# Patient Record
Sex: Male | Born: 1950 | Race: Asian | Hispanic: No | Marital: Married | Smoking: Never smoker
Health system: Southern US, Community
[De-identification: ages and names within clinical notes are randomized; demographics above are authoritative.]

## PROBLEM LIST (undated history)

## (undated) DIAGNOSIS — I4891 Unspecified atrial fibrillation: Secondary | ICD-10-CM

## (undated) DIAGNOSIS — Z8669 Personal history of other diseases of the nervous system and sense organs: Secondary | ICD-10-CM

## (undated) DIAGNOSIS — I10 Essential (primary) hypertension: Secondary | ICD-10-CM

## (undated) DIAGNOSIS — K635 Polyp of colon: Secondary | ICD-10-CM

## (undated) HISTORY — DX: Personal history of other diseases of the nervous system and sense organs: Z86.69

## (undated) HISTORY — PX: EYE SURGERY: SHX253

## (undated) HISTORY — PX: OTHER SURGICAL HISTORY: SHX169

## (undated) HISTORY — DX: Polyp of colon: K63.5

## (undated) HISTORY — DX: Unspecified atrial fibrillation: I48.91

## (undated) HISTORY — PX: COLONOSCOPY: SHX174

## (undated) HISTORY — DX: Essential (primary) hypertension: I10

## (undated) HISTORY — PX: LIPOMA EXCISION: SHX5283

---

## 2008-06-28 ENCOUNTER — Ambulatory Visit (HOSPITAL_COMMUNITY): Admission: RE | Admit: 2008-06-28 | Discharge: 2008-06-28 | Payer: Self-pay | Admitting: General Surgery

## 2008-07-26 ENCOUNTER — Ambulatory Visit: Payer: Self-pay | Admitting: Family Medicine

## 2008-07-26 DIAGNOSIS — I10 Essential (primary) hypertension: Secondary | ICD-10-CM | POA: Insufficient documentation

## 2008-08-02 ENCOUNTER — Encounter (INDEPENDENT_AMBULATORY_CARE_PROVIDER_SITE_OTHER): Payer: Self-pay | Admitting: General Surgery

## 2008-08-02 ENCOUNTER — Ambulatory Visit (HOSPITAL_COMMUNITY): Admission: RE | Admit: 2008-08-02 | Discharge: 2008-08-02 | Payer: Self-pay | Admitting: General Surgery

## 2008-09-06 ENCOUNTER — Telehealth: Payer: Self-pay | Admitting: Family Medicine

## 2008-09-09 ENCOUNTER — Ambulatory Visit: Payer: Self-pay | Admitting: Family Medicine

## 2008-09-09 DIAGNOSIS — R42 Dizziness and giddiness: Secondary | ICD-10-CM | POA: Insufficient documentation

## 2008-10-27 ENCOUNTER — Encounter: Payer: Self-pay | Admitting: Family Medicine

## 2008-11-09 ENCOUNTER — Encounter: Admission: RE | Admit: 2008-11-09 | Discharge: 2008-11-09 | Payer: Self-pay | Admitting: Otolaryngology

## 2009-02-21 ENCOUNTER — Ambulatory Visit: Payer: Self-pay | Admitting: Family Medicine

## 2009-02-21 DIAGNOSIS — M6530 Trigger finger, unspecified finger: Secondary | ICD-10-CM | POA: Insufficient documentation

## 2009-02-21 DIAGNOSIS — K0501 Acute gingivitis, non-plaque induced: Secondary | ICD-10-CM | POA: Insufficient documentation

## 2009-05-03 ENCOUNTER — Telehealth: Payer: Self-pay | Admitting: Family Medicine

## 2009-10-06 ENCOUNTER — Ambulatory Visit: Payer: Self-pay | Admitting: Family Medicine

## 2009-10-06 DIAGNOSIS — M109 Gout, unspecified: Secondary | ICD-10-CM | POA: Insufficient documentation

## 2009-10-07 LAB — CONVERTED CEMR LAB: Uric Acid, Serum: 7.1 mg/dL (ref 4.0–7.8)

## 2009-10-11 ENCOUNTER — Ambulatory Visit: Payer: Self-pay | Admitting: Family Medicine

## 2009-10-11 DIAGNOSIS — G4700 Insomnia, unspecified: Secondary | ICD-10-CM | POA: Insufficient documentation

## 2009-12-14 ENCOUNTER — Ambulatory Visit: Payer: Self-pay | Admitting: Family Medicine

## 2010-01-11 ENCOUNTER — Ambulatory Visit: Payer: Self-pay | Admitting: Family Medicine

## 2010-02-14 NOTE — Assessment & Plan Note (Signed)
Summary: elevated BP/dm   Vital Signs:  Patient profile:   60 year old male Weight:      155 pounds Temp:     98.6 degrees F oral BP sitting:   150 / 98  (left arm) Cuff size:   regular  Vitals Entered By: Sid Falcon LPN (December 14, 2009 8:15 AM)  Serial Vital Signs/Assessments:  Time      Position  BP       Pulse  Resp  Temp     By                     138/80                         Evelena Peat MD   History of Present Illness: Here to evaluate hypertension.  Up since recent gout flare. Home readings around 150/90s.  Not taking indocin or ETOH. Limits sodium. Takes Enalapril and metoprolol consistently.  Hypertension History:      He denies headache, chest pain, palpitations, dyspnea with exertion, orthopnea, PND, peripheral edema, visual symptoms, neurologic problems, syncope, and side effects from treatment.        Positive major cardiovascular risk factors include male age 38 years old or older and hypertension.  Negative major cardiovascular risk factors include non-tobacco-user status.     Allergies (verified): No Known Drug Allergies  Past History:  Past Medical History: Last updated: 10/11/2009 Hypertension ? past hx of atrial fibrillation Gout 9/11  Physical Exam  General:  Well-developed,well-nourished,in no acute distress; alert,appropriate and cooperative throughout examination Neck:  No deformities, masses, or tenderness noted. Lungs:  Normal respiratory effort, chest expands symmetrically. Lungs are clear to auscultation, no crackles or wheezes. Heart:  Normal rate and regular rhythm. S1 and S2 normal without gallop, murmur, click, rub or other extra sounds. Extremities:  No clubbing, cyanosis, edema, or deformity noted with normal full range of motion of all joints.     Impression & Recommendations:  Problem # 1:  HYPERTENSION (ICD-401.9) Assessment Deteriorated d/c enalapril and start Lotrel.  Cont metoprolol and reassess one months. His  updated medication list for this problem includes:    Amlodipine Besy-benazepril Hcl 5-10 Mg Caps (Amlodipine besy-benazepril hcl) ..... One by mouth once daily    Metoprolol Tartrate 50 Mg Tabs (Metoprolol tartrate) ..... One tab two times a day  Complete Medication List: 1)  Amlodipine Besy-benazepril Hcl 5-10 Mg Caps (Amlodipine besy-benazepril hcl) .... One by mouth once daily 2)  Metoprolol Tartrate 50 Mg Tabs (Metoprolol tartrate) .... One tab two times a day 3)  Aspir-low 81 Mg Tbec (Aspirin) .... Once daily 4)  Indomethacin 50 Mg Caps (Indomethacin) .... One by mouth q 8 hours as needed foot pain 5)  Ambien 10 Mg Tabs (Zolpidem tartrate) .... One by mouth at bedtime as needed insomnia  Hypertension Assessment/Plan:      The patient's hypertensive risk group is category B: At least one risk factor (excluding diabetes) with no target organ damage.  Today's blood pressure is 150/98.    Patient Instructions: 1)  Please schedule a follow-up appointment in 1 month.  2)  Discontinue Enalapril and start Lotrel one daily. Prescriptions: AMLODIPINE BESY-BENAZEPRIL HCL 5-10 MG CAPS (AMLODIPINE BESY-BENAZEPRIL HCL) one by mouth once daily  #30 x 5   Entered and Authorized by:   Evelena Peat MD   Signed by:   Evelena Peat MD on 12/14/2009   Method used:  [facility]  [place] [address] [facility]       [address]       [place], [place]  [address]       Ph: [phone] or [phone]       Fax: [phone]   RxID:   [id number]    Orders Added: 1)  Est. Patient Level III [42595]

## 2010-02-14 NOTE — Progress Notes (Signed)
Summary: Penicillin refill request  Phone Note From Pharmacy   [facility]  [place] [address] Call For: Mark [name]  Summary of Call: Pharmacy faxed Rx refill for Penicillin.  I called pt to explain reason for request, he states his gums are swollen again Initial call taken by: Sid Falcon LPN,  May 03, 2009 12:47 PM  Follow-up for Phone Call        OK to refill once PCN V 500 mg by mouth three times a day for 10 days. Follow-up by: Evelena Peat MD,  May 03, 2009 12:54 PM    Prescriptions: PENICILLIN V POTASSIUM 500 MG TABS (PENICILLIN V POTASSIUM) one by mouth three times a day for 10 days  #30 x 0   Entered by:   Sid Falcon LPN   Authorized by:   Evelena Peat MD   Signed by:   Sid Falcon LPN on 16/10/9602   Method used:   [facility]        [facility]  [place] [address] [facility]       [address]       [place], [place]  [address]       Ph: [phone] or [phone]       Fax: [phone]   RxID:   [id number]

## 2010-02-14 NOTE — Assessment & Plan Note (Signed)
Summary: foot pain/njr   Vital Signs:  Patient profile:   60 year old male Weight:      154 pounds Temp:     98.6 degrees F oral BP sitting:   150 / 100  (left arm) Cuff size:   regular  Vitals Entered By: Sid Falcon LPN (October 06, 2009 2:58 PM) CC: right foot reddened, painful X 1 week   History of Present Illness: Same-day appointment. Right foot pain metatarsophalangeal joint one week. No injury. No fever. Has taken no medications. No past history of gout.  Pain is moderate severity. He has noticed some edema and erythema. No other joint involvement. Minimal alcohol use.  Allergies (verified): No Known Drug Allergies  Past History:  Past Medical History: Last updated: 07/26/2008 Hypertension ? past hx of atrial fibrillation  Review of Systems      See HPI  Physical Exam  General:  Well-developed,well-nourished,in no acute distress; alert,appropriate and cooperative throughout examination Lungs:  Normal respiratory effort, chest expands symmetrically. Lungs are clear to auscultation, no crackles or wheezes. Heart:  Normal rate and regular rhythm. S1 and S2 normal without gallop, murmur, click, rub or other extra sounds. Extremities:  patient has erythema, warmth and mild edema right MTP joint. Minimal to moderate tenderness to palpation.   Impression & Recommendations:  Problem # 1:  GOUT, UNSPECIFIED (ICD-274.9) Assessment New very likely gout, though no prior hx of.  Start Indocin .  Uric acid level and f/u one week. Orders: TLB-Uric Acid, Blood ([id number]-[id number])  His updated medication list for this problem includes:    Indomethacin 50 Mg Caps (Indomethacin) ..... One by mouth q 8 hours as needed foot pain  Complete Medication List: 1)  Enalapril Maleate 20 Mg Tabs (Enalapril maleate) .... One tab two times a day 2)  Metoprolol Tartrate 50 Mg Tabs (Metoprolol tartrate) .... One tab two times a day 3)  Aspir-low 81 Mg Tbec (Aspirin) .... Once daily 4)   Indomethacin 50 Mg Caps (Indomethacin) .... One by mouth q 8 hours as needed foot pain  Patient Instructions: 1)  Scheduled follow up appointment in one week 2)  Drink plenty of water Prescriptions: INDOMETHACIN 50 MG CAPS (INDOMETHACIN) one by mouth q 8 hours as needed foot pain  #40 x 0   Entered and Authorized by:   Evelena Peat MD   Signed by:   Evelena Peat MD on 10/06/2009   Method used:   [facility]  [place] [address]       [address]       [place], [place]  [address]       Ph: [phone] or [phone]       Fax: [phone]   RxID:   [id number]

## 2010-02-14 NOTE — Assessment & Plan Note (Signed)
Summary: F/U TO REASSESS GOUT? // RS---PT [facility] // RS/pt rescd//ccm   Vital Signs:  Patient profile:   60 year old male Temp:     98.4 degrees F oral BP sitting:   160 / 110  (left arm) Cuff size:   regular  Vitals Entered By: Sid Falcon LPN (October 11, 2009 1:25 PM)  History of Present Illness: Patient here for the following.  Reassess gout right foot. Initial occurrence. Improved promptly with indomethacin. No pain whatsoever this time. Had some insomnia which he attributed to indomethacin. Took last dose yesterday. Ambulating without pain. Drinking plenty of fluids.  No regular alcohol use.  Uric acid 7.1 .  No recent foot injury.  Hypertension treated with enalapril and metoprolol. Compliant with medications. No chest pains  patient complaining of insomnia past few nights. He thinks this is related to indomethacin. Minimal caffeine use. No regular alcohol use. Took Advil PM without relief. Only got one to 2 hours sleep last night. In inquiring about possible sleep aids. No recent new stressors.  Allergies (verified): No Known Drug Allergies  Past History:  Family History: Last updated: 07/26/2008 Family History Hypertension, mother Family history stroke, mother Family history breast cancer, sister  Social History: Last updated: 07/26/2008 Occupation:  Sports administrator Married Never Smoked Alcohol use-yes  Risk Factors: Smoking Status: never (02/21/2009)  Past Medical History: Hypertension ? past hx of atrial fibrillation Gout 9/11 PMH-FH-SH reviewed for relevance  Review of Systems  The patient denies anorexia, fever, weight loss, chest pain, syncope, dyspnea on exertion, peripheral edema, prolonged cough, headaches, hemoptysis, abdominal pain, melena, hematochezia, and severe indigestion/heartburn.    Physical Exam  General:  Well-developed,well-nourished,in no acute distress; alert,appropriate and cooperative throughout examination Mouth:  Oral mucosa  and oropharynx without lesions or exudates.  Teeth in good repair. Neck:  No deformities, masses, or tenderness noted. Lungs:  Normal respiratory effort, chest expands symmetrically. Lungs are clear to auscultation, no crackles or wheezes. Heart:  Normal rate and regular rhythm. S1 and S2 normal without gallop, murmur, click, rub or other extra sounds. Extremities:  resolution of recent redness warmth and tenderness right metatarsophalangeal joint Psych:  normally interactive, good eye contact, not anxious appearing, and not depressed appearing.     Impression & Recommendations:  Problem # 1:  GOUT, UNSPECIFIED (ICD-274.9) Assessment Improved  His updated medication list for this problem includes:    Indomethacin 50 Mg Caps (Indomethacin) ..... One by mouth q 8 hours as needed foot pain  Problem # 2:  HYPERTENSION (ICD-401.9) Assessment: Deteriorated ?exacerbated by Indocin. His updated medication list for this problem includes:    Enalapril Maleate 20 Mg Tabs (Enalapril maleate) ..... One tab two times a day    Metoprolol Tartrate 50 Mg Tabs (Metoprolol tartrate) ..... One tab two times a day  Problem # 3:  INSOMNIA, TRANSIENT (ICD-780.52) Assessment: New short term trial of Ambien and sleep hygiene handdout given. His updated medication list for this problem includes:    Ambien 10 Mg Tabs (Zolpidem tartrate) ..... One by mouth at bedtime as needed insomnia  Complete Medication List: 1)  Enalapril Maleate 20 Mg Tabs (Enalapril maleate) .... One tab two times a day 2)  Metoprolol Tartrate 50 Mg Tabs (Metoprolol tartrate) .... One tab two times a day 3)  Aspir-low 81 Mg Tbec (Aspirin) .... Once daily 4)  Indomethacin 50 Mg Caps (Indomethacin) .... One by mouth q 8 hours as needed foot pain 5)  Ambien 10 Mg Tabs (Zolpidem tartrate) .Marland KitchenMarland KitchenMarland Kitchen  One by mouth at bedtime as needed insomnia  Patient Instructions: 1)  Please schedule a follow-up appointment in 3 months .  2)  Check your  Blood  Pressure regularly . If it is above:140/90   you should make an appointment. 3)  Use ambien only on short term as needed basis. Prescriptions: AMBIEN 10 MG TABS (ZOLPIDEM TARTRATE) one by mouth at bedtime as needed insomnia  #15 x 0   Entered and Authorized by:   Evelena Peat MD   Signed by:   Evelena Peat MD on 10/11/2009   Method used:   Print then Give to Patient   RxID:   [id number]

## 2010-02-14 NOTE — Assessment & Plan Note (Signed)
Summary: med check/refills/cjr   Vital Signs:  Patient profile:   60 year old male Weight:      152 pounds Temp:     99.2 degrees F oral BP sitting:   128 / 82  (left arm) Cuff size:   regular  Vitals Entered By: Mark Falcon LPN (February 21, 2009 8:26 AM) CC: med check, Hypertension Management   History of Present Illness: Seen today for the following items.  Hypertension treated with enalapril metoprolol. Compliant with medications. No concerning side effects. No dizziness or orthostatic symptoms.  New problem of trigger finger right fourth digit. 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 Not disabling. Symptoms actually improved last couple weeks. Right-hand-dominant.  Gum pain left lower gumline. No dental care. No purulent drainage. History of similar occurrence in the past which responded to antibiotics. No fevers or chills.  Recent vertigo w/u with MRI neg.  Symptoms stable.  Hypertension History:      He denies headache, chest pain, palpitations, dyspnea with exertion, orthopnea, PND, peripheral edema, visual symptoms, neurologic problems, syncope, and side effects from treatment.  He notes no problems with any antihypertensive medication side effects.        Positive major cardiovascular risk factors include male age 20 years old or older and hypertension.  Negative major cardiovascular risk factors include non-tobacco-user status.     Preventive Screening-Counseling & Management  Alcohol-Tobacco     Smoking Status: never  Allergies (verified): No Known Drug Allergies  Past History:  Past Medical History: Last updated: 07/26/2008 Hypertension ? 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    Physical Exam  General:  Well-developed,well-nourished,in no acute distress; alert,appropriate and cooperative throughout examination Eyes:  No corneal or conjunctival inflammation noted. EOMI. Perrla. Funduscopic exam benign, without hemorrhages, exudates or papilledema. Vision grossly normal. Ears:  External ear exam shows no significant lesions or deformities.  Otoscopic examination reveals clear canals, tympanic membranes are intact bilaterally without bulging, retraction, inflammation or discharge. Hearing is grossly normal bilaterally. Mouth:  patient has some erythema and minimal gum swelling left lower gum line and near the incisors. No purulent drainage noted. Otherwise oropharynx clear Neck:  No deformities, masses, or tenderness noted. Lungs:  Normal respiratory effort, chest expands symmetrically. Lungs are clear to auscultation, no crackles or wheezes. Heart:  Normal rate and regular rhythm. S1 and S2 normal without gallop, murmur, click, rub or other extra sounds. Extremities:  thickened nodule right fourth digit in palm. No significant trigger finger noted this time.   Impression & Recommendations:  Problem # 1:  HYPERTENSION (ICD-401.9) Assessment Unchanged refill meds. His updated medication list for this problem includes:    Enalapril Maleate 20 Mg Tabs (Enalapril maleate) ..... One tab two times a day    Metoprolol Tartrate 50 Mg Tabs (Metoprolol tartrate) ..... One tab two times a day  Problem # 2:  TRIGGER FINGER (ICD-727.03) Assessment: New discuss treatment options including corticosteroid injection this point he wishes to wait and observe  Problem # 3:  ACUTE GINGIVITIS NONPLAQUE INDUCED  (ICD-523.01) Assessment: New start antibiotic. Dental  followup if symptoms do not improve or resolve next couple of weeks  Complete Medication List: 1)  Enalapril Maleate 20 Mg Tabs (Enalapril maleate) .... One tab two times a day 2)  Metoprolol Tartrate 50 Mg Tabs (Metoprolol tartrate) .... One tab two times a day 3)  Aspir-low 81 Mg Tbec (Aspirin) .... Once daily 4)  Penicillin V Potassium 500 Mg Tabs (Penicillin v potassium) .... One by mouth three times a day for 10 days  Hypertension Assessment/Plan:      The patient's hypertensive risk group is category B: At least one risk factor (excluding diabetes) with no target organ damage.  Today's blood pressure is 128/82.    Patient Instructions: 1)  Consider scheduling complete physical examination at some point later this year Prescriptions: METOPROLOL TARTRATE 50 MG TABS (METOPROLOL TARTRATE) one tab two times a day  #180 x 3   Entered and Authorized by:   Evelena Peat MD   Signed by:   Evelena Peat MD on 02/21/2009   Method used:   [facility]        [facility]  [place] [address] [facility]       [address]       [place], [place]  [address]       Ph: [phone] or [phone]       Fax: [phone]   RxID:   [id number] ENALAPRIL MALEATE 20 MG TABS (ENALAPRIL MALEATE) one tab two times a day  #180 x 3   Entered and Authorized by:   Evelena Peat MD   Signed by:   Evelena Peat MD on 02/21/2009   Method used:   [facility]        [facility]  [place] [address]       [address]       [place], [place]  [address]       Ph: [phone] or [phone]       Fax: [phone]   RxID:   [id number] PENICILLIN V POTASSIUM 500 MG TABS (PENICILLIN V POTASSIUM) one by mouth three times a day for 10 days  #30 x 0   Entered and Authorized by:   Evelena Peat MD   Signed by:   Evelena Peat MD on 02/21/2009   Method used:   [facility]        [facility]  [place] [address]       [address]        [place], [place]  [address]       Ph: [phone] or [phone]       Fax: [phone]   RxID:   [id number]

## 2010-02-16 NOTE — Assessment & Plan Note (Signed)
Summary: 1 month rov//acm pt rsc/njr   Vital Signs:  Patient profile:   60 year old male Weight:      154 pounds Temp:     98.5 degrees F oral BP sitting:   122 / 80  (left arm) Cuff size:   regular  Vitals Entered By: Sid Falcon LPN (January 11, 2010 8:18 AM)  History of Present Illness: Here for follow up hypertension. Changed from enalapril to Lotrel and BP have improved. No side effects and BP around 120/75 average. No headaches, edema, or cough.    Hypertension History:      He denies headache, chest pain, palpitations, dyspnea with exertion, orthopnea, peripheral edema, neurologic problems, syncope, and side effects from treatment.        Positive major cardiovascular risk factors include male age 4 years old or older and hypertension.  Negative major cardiovascular risk factors include non-tobacco-user status.     Allergies (verified): No Known Drug Allergies  Past History:  Past Medical History: Last updated: 10/11/2009 Hypertension ? past hx of atrial fibrillation Gout 9/11 PMH reviewed for relevance  Review of Systems      See HPI  Physical Exam  General:  Well-developed,well-nourished,in no acute distress; alert,appropriate and cooperative throughout examination Head:  Normocephalic and atraumatic without obvious abnormalities. No apparent alopecia or balding. Neck:  No deformities, masses, or tenderness noted. Lungs:  Normal respiratory effort, chest expands symmetrically. Lungs are clear to auscultation, no crackles or wheezes. Heart:  Normal rate and regular rhythm. S1 and S2 normal without gallop, murmur, click, rub or other extra sounds. Extremities:  no edema   Impression & Recommendations:  Problem # 1:  HYPERTENSION (ICD-401.9) Assessment Improved  His updated medication list for this problem includes:    Amlodipine Besy-benazepril Hcl 5-10 Mg Caps (Amlodipine besy-benazepril hcl) ..... One by mouth once daily    Metoprolol Tartrate 50  Mg Tabs (Metoprolol tartrate) ..... One tab two times a day  Complete Medication List: 1)  Amlodipine Besy-benazepril Hcl 5-10 Mg Caps (Amlodipine besy-benazepril hcl) .... One by mouth once daily 2)  Metoprolol Tartrate 50 Mg Tabs (Metoprolol tartrate) .... One tab two times a day 3)  Aspir-low 81 Mg Tbec (Aspirin) .... Once daily 4)  Indomethacin 50 Mg Caps (Indomethacin) .... One by mouth q 8 hours as needed foot pain 5)  Ambien 10 Mg Tabs (Zolpidem tartrate) .... One by mouth at bedtime as needed insomnia  Hypertension Assessment/Plan:      The patient's hypertensive risk group is category B: At least one risk factor (excluding diabetes) with no target organ damage.  Today's blood pressure is 122/80.    Patient Instructions: 1)  Please schedule a follow-up appointment in 6 months .  2)  Check your  Blood Pressure regularly . If it is above:140/90   you should make an appointment.   Orders Added: 1)  Est. Patient Level III [[address]]

## 2010-03-15 ENCOUNTER — Other Ambulatory Visit: Payer: Self-pay | Admitting: Family Medicine

## 2010-03-15 DIAGNOSIS — I10 Essential (primary) hypertension: Secondary | ICD-10-CM

## 2010-03-15 NOTE — Telephone Encounter (Signed)
[facility] and Amlodipine. 3 month supply with refills. Pls call in to [facility].

## 2010-03-16 MED ORDER — METOPROLOL TARTRATE 50 MG PO TABS: 50.0000 mg | ORAL_TABLET | Freq: Two times a day (BID) | ORAL | Status: DC

## 2010-03-16 MED ORDER — AMLODIPINE BESY-BENAZEPRIL HCL 5-10 MG PO CAPS: 1.0000 | ORAL_CAPSULE | Freq: Every day | ORAL | Status: DC

## 2010-03-16 NOTE — Telephone Encounter (Signed)
Rx sent electronically.  

## 2010-04-23 LAB — BASIC METABOLIC PANEL
Chloride: 106 mEq/L (ref 96–112)
GFR calc Af Amer: >60 mL/min (ref >60)
GFR calc non Af Amer: >60 mL/min (ref >60)
Potassium: 4.4 mEq/L (ref 3.5–5.1)
Sodium: 140 mEq/L (ref 135–145)

## 2010-04-23 LAB — DIFFERENTIAL
Eosinophils Absolute: 0.2 10*3/uL (ref 0.0–0.7)
Eosinophils Relative: 5 % (ref 0–5)
Lymphocytes Relative: 22 % (ref 12–46)
Lymphs Abs: 1.1 10*3/uL (ref 0.7–4.0)
Monocytes Relative: 7 % (ref 3–12)

## 2010-04-23 LAB — CBC
HCT: 50.7 % (ref 39.0–52.0)
MCV: 93.3 fL (ref 78.0–100.0)
RBC: 5.44 MIL/uL (ref 4.22–5.81)
WBC: 5.1 10*3/uL (ref 4.0–10.5)

## 2010-04-24 LAB — BASIC METABOLIC PANEL
CO2: 32 mEq/L (ref 19–32)
Calcium: 9.7 mg/dL (ref 8.4–10.5)
Chloride: 105 mEq/L (ref 96–112)
Creatinine, Ser: 1.03 mg/dL (ref 0.4–1.5)
Glucose, Bld: 103 mg/dL — ABNORMAL HIGH (ref 70–99)
Sodium: 143 mEq/L (ref 135–145)

## 2010-04-24 LAB — DIFFERENTIAL
Basophils Absolute: 0 10*3/uL (ref 0.0–0.1)
Eosinophils Relative: 6 % — ABNORMAL HIGH (ref 0–5)
Lymphocytes Relative: 29 % (ref 12–46)
Lymphs Abs: 1.4 10*3/uL (ref 0.7–4.0)
Monocytes Absolute: 0.4 10*3/uL (ref 0.1–1.0)

## 2010-04-24 LAB — CBC
HCT: 51 % (ref 39.0–52.0)
Hemoglobin: 17.9 g/dL — ABNORMAL HIGH (ref 13.0–17.0)
RDW: 13 % (ref 11.5–15.5)

## 2010-05-22 ENCOUNTER — Telehealth: Payer: Self-pay | Admitting: Family Medicine

## 2010-05-22 DIAGNOSIS — Z299 Encounter for prophylactic measures, unspecified: Secondary | ICD-10-CM

## 2010-05-22 NOTE — Telephone Encounter (Signed)
Please advise 

## 2010-05-22 NOTE — Telephone Encounter (Signed)
Pt needs refill

## 2010-05-22 NOTE — Telephone Encounter (Signed)
I would rec referral to [facility] unless he has another preference.

## 2010-05-22 NOTE — Telephone Encounter (Signed)
Pt called and said that he had a colonoscopy 5 yrs ago by Dr Sherin Quarry. This doctor has retired, so pt is req a referral to get another GI Doctor and to get colonoscopy done in early July 2012.

## 2010-05-23 NOTE — Telephone Encounter (Signed)
Referral done, pt informed

## 2010-05-30 NOTE — Op Note (Signed)
Mark [name], Mark [name]                   ACCOUNT NO.:  [id number]   MEDICAL RECORD NO.:  [id number]          PATIENT TYPE:  AMB   LOCATION:  SDS                          FACILITY:  [facility]   PHYSICIAN:  Juanetta Gosling, MDDATE OF BIRTH:  [birth year]   DATE OF PROCEDURE:  08/02/2008  DATE OF DISCHARGE:  08/02/2008                               OPERATIVE REPORT   PREOPERATIVE DIAGNOSIS:  Back mass.   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   PROCEDURE:  Back mass excisional biopsy.   SURGEON:  Juanetta Gosling, MD   ASSISTANT:  None.   ANESTHESIA:  General in the prone position.   SPECIMEN:  Lipoma to pathology.   ESTIMATED BLOOD LOSS:  Minimal.   COMPLICATIONS:  None.   DRAINS:  None.   SUPERVISING ANESTHESIOLOGIST:  Sheldon Silvan, MD   DISPOSITION:  Recovery room in stable condition.   HISTORY:  [name] is a 60 year old male with a number of years' history  of an upper back mass that has been slowly increasing in the size over  that time.  He came in to have this evaluated this for removal.  He had  about a 4- x 3-cm mobile mass appearing consistent with a lipoma.  On  his preoperative evaluation, he was noted to have asymptomatic atrial  fibrillation.  He was evaluated by Cardiology and risk stratified.  We  discussed an excisional biopsy of this mass.   PROCEDURE:  After informed consent was obtained, the patient was taken  to the operating room.  He was administered 1 g of intravenous  cefazolin.  He had sequential compression devices placed on his lower  extremities prior to operation.  He was then placed under general  endotracheal anesthesia without complication.  He was placed in the  prone position and appropriately padded.  He was then prepped with  ChloraPrep.  Three minutes were allowed to pass.  He was then draped in  a standard sterile surgical fashion.  Surgical time-out was then  performed.   The lipoma was  identified, about 5-cm incision was then made overlying  this.  Dissection carried out down to the level of the mass.  This was  removed in total and passed off the table as a specimen.  This was  adherent to the muscle, and I did remove some of the fascia along with  it, but it appeared well encapsulated and appeared benign on removal.  There was one feeder vessel that I required a 3-0 Vicryl stitch in that  position to stop the bleeding.  Irrigation was performed.  Hemostasis  was observed.  I put a couple of deep sutures of 2-0 Vicryl to pull the  layers together and closed the dermis with 4-0 Vicryl interrupted  sutures.  The skin was closed with a 4-0 Monocryl in a subcuticular  fashion.  Steri-Strips and sterile dressing were placed over this.  He  tolerated this well, was rolled supine, and extubated in the operating  room, having tolerated this well.  Juanetta Gosling, MD  Electronically Signed     MCW/MEDQ  D:  08/02/2008  T:  08/03/2008  Job:  [id number]

## 2010-06-14 ENCOUNTER — Telehealth: Payer: Self-pay | Admitting: Family Medicine

## 2010-06-14 DIAGNOSIS — Z1211 Encounter for screening for malignant neoplasm of colon: Secondary | ICD-10-CM

## 2010-06-14 NOTE — Telephone Encounter (Signed)
Pt would like a colonoscopy due to age on 7-2 or 07-18-2010.

## 2010-06-14 NOTE — Telephone Encounter (Signed)
Agree that he should have screening colonoscopy at his age.  OK to refer.  I will make referral,

## 2010-06-15 ENCOUNTER — Other Ambulatory Visit: Payer: Self-pay | Admitting: Internal Medicine

## 2010-06-15 NOTE — Telephone Encounter (Signed)
Pt is aware doc will make referral for colonoscopy

## 2010-07-03 ENCOUNTER — Telehealth: Payer: Self-pay | Admitting: Family Medicine

## 2010-07-03 NOTE — Telephone Encounter (Signed)
Wants referral for to see a specialist about his back pain.

## 2010-07-03 NOTE — Telephone Encounter (Signed)
Pt informed and he voiced his understanding. 

## 2010-07-03 NOTE — Telephone Encounter (Signed)
We have not seen him here yet for his back pain and would rec evaluation here first.

## 2010-07-17 ENCOUNTER — Other Ambulatory Visit: Payer: Self-pay | Admitting: Gastroenterology

## 2010-07-20 ENCOUNTER — Encounter: Payer: Self-pay | Admitting: Family Medicine

## 2010-07-20 ENCOUNTER — Ambulatory Visit (INDEPENDENT_AMBULATORY_CARE_PROVIDER_SITE_OTHER): Payer: BC Managed Care – PPO | Admitting: Family Medicine

## 2010-07-20 VITALS — BP 94/74 | Temp 98.4°F | Ht 68.0 in | Wt 150.0 lb

## 2010-07-20 DIAGNOSIS — M109 Gout, unspecified: Secondary | ICD-10-CM

## 2010-07-20 MED ORDER — PREDNISONE 10 MG PO TABS: ORAL_TABLET | ORAL | Status: DC

## 2010-07-20 NOTE — Progress Notes (Signed)
  Subjective:    Patient ID: Mark [name], male    DOB: [birth year], 61 y.o.   MRN: [id number]  HPI Acute monoarticular arthritis right wrist. Onset 2 days ago. No injury. No fever. Known history of gout.  He used some type of over-the-counter Chinese herbal supplement with minimal improvement. Previous involvement of foot.  No use of hydrochlorothiazide. Minimal alcohol use. No clear dietary precipitants. No alleviating factors.   Review of Systems  Constitutional: Negative for fever and chills.  Skin: Negative for rash.  Hematological: Negative for adenopathy.       Objective:   Physical Exam  Constitutional: He is oriented to person, place, and time. He appears well-developed and well-nourished.  Cardiovascular: Normal rate, regular rhythm and normal heart sounds.   Pulmonary/Chest: Effort normal and breath sounds normal. No respiratory distress. He has no wheezes.  Musculoskeletal: He exhibits edema.       Right wrist is edematous and moderately warm to touch. He has diffuse tenderness of the joint and significant erythema. Limited range of motion secondary to pain. No skin rash  Neurological: He is alert and oriented to person, place, and time.          Assessment & Plan:  Acute gout right wrist. Previous intolerance to Indocin. Prednisone taper. Discussed triggering factors. Discussed consideration for preventative if more frequent flareups

## 2010-07-20 NOTE — Patient Instructions (Signed)
Gout Gout is an inflammatory condition (arthritis) caused by a buildup of uric acid crystals in the joints. Uric acid is a chemical that is normally present in the blood. Under some circumstances, uric acid can form into crystals in your joints. This causes joint redness, soreness, and swelling (inflammation). Repeat attacks are common. Over time, uric acid crystals can form into masses (tophi) near a joint, causing disfigurement. Gout is treatable and often preventable. CAUSES The disease begins with elevated levels of uric acid in the blood. Uric acid is produced by your body when it breaks down a naturally found substance called purines. This also happens when you eat certain foods such as meats and fish. Causes of an elevated uric acid level include:  Being passed down from parent to child (heredity).   Diseases that cause increased uric acid production (obesity, psoriasis, some cancers).   Excessive alcohol use.   Diet, especially diets rich in meat and seafood.   Medicines, including certain cancer-fighting drugs (chemotherapy), diuretics, and aspirin.   Chronic kidney disease. The kidneys are no longer able to remove uric acid well.   Problems with metabolism.  Conditions strongly associated with gout include:  Obesity.   High blood pressure.   High cholesterol.   Diabetes.  Not everyone with elevated uric acid levels gets gout. It is not understood why some people get gout and others do not. Surgery, joint injury, and eating too much of certain foods are some of the factors that can lead to gout. SYMPTOMS  An attack of gout comes on quickly. It causes intense pain with redness, swelling, and warmth in a joint.   Fever can occur.   Often, only one joint is involved. Certain joints are more commonly involved:   Base of the big toe.   Knee.   Ankle.   Wrist.   Finger.  Without treatment, an attack usually goes away in a few days to weeks. Between attacks, you usually  will not have symptoms, which is different from many other forms of arthritis. DIAGNOSIS Your caregiver will suspect gout based on your symptoms and exam. Removal of fluid from the joint (arthrocentesis) is done to check for uric acid crystals. Your caregiver will give you a medicine that numbs the area (local anesthetic) and use a needle to remove joint fluid for exam. Gout is confirmed when uric acid crystals are seen in joint fluid, using a special microscope. Sometimes, blood, urine, and X-ray tests are also used. TREATMENT There are 2 phases to gout treatment: treating the sudden onset (acute) attack and preventing attacks (prophylaxis). Treatment of an Acute Attack  Medicines are used. These include anti-inflammatory medicines or steroid medicines.   An injection of steroid medicine into the affected joint is sometimes necessary.   The painful joint is rested. Movement can worsen the arthritis.   You may use warm or cold treatments on painful joints, depending which works best for you.   Discuss the use of coffee, vitamin C, or cherries with your caregiver. These may be helpful treatment options.  Treatment to Prevent Attacks After the acute attack subsides, your caregiver may advise prophylactic medicine. These medicines either help your kidneys eliminate uric acid from your body or decrease your uric acid production. You may need to stay on these medicines for a very long time. The early phase of treatment with prophylactic medicine can be associated with an increase in acute gout attacks. For this reason, during the first few months of treatment, your caregiver   may also advise you to take medicines usually used for acute gout treatment. Be sure you understand your caregiver's directions. You should also discuss dietary treatment with your caregiver. Certain foods such as meats and fish can increase uric acid levels. Other foods such as dairy can decrease levels. Your caregiver can give  you a list of foods to avoid. HOME CARE INSTRUCTIONS  Do not take aspirin to relieve pain. This raises uric acid levels.   Only take over-the-counter or prescription medicines for pain, discomfort, or fever as directed by your caregiver.   Rest the joint as much as possible. When in bed, keep sheets and blankets off painful areas.   Keep the affected joint raised (elevated).   Use crutches if the painful joint is in your leg.   Drink enough water and fluids to keep your urine clear or pale yellow. This helps your body get rid of uric acid. Do not drink alcoholic beverages. They slow the passage of uric acid.   Follow your caregiver's dietary instructions. Pay careful attention to the amount of protein you eat. Your daily diet should emphasize fruits, vegetables, whole grains, and fat-free or low-fat milk products.   Maintain a healthy body weight.  SEEK MEDICAL CARE IF:  You have an oral temperature above 100.   You develop diarrhea, vomiting, or any side effects from medicines.   You do not feel better in 24 hours, or you are getting worse.  SEEK IMMEDIATE MEDICAL CARE IF:  Your joint becomes suddenly more tender and you have:   Chills.   An oral temperature above 100, not controlled by medicine.  MAKE SURE YOU:  Understand these instructions.   Will watch your condition.   Will get help right away if you are not doing well or get worse.  Document Released: 12/30/1999 Document Re-Released: 06/21/2009 [employer] Patient Information 2011 [employer]. 

## 2010-07-26 ENCOUNTER — Telehealth: Payer: Self-pay | Admitting: *Deleted

## 2010-07-26 NOTE — Telephone Encounter (Signed)
Pt. Says he was unaware of today's visit.  [facility] previsit for 08/01/10 at 8:30am

## 2010-08-01 ENCOUNTER — Ambulatory Visit (AMBULATORY_SURGERY_CENTER): Payer: BC Managed Care – PPO

## 2010-08-01 ENCOUNTER — Encounter: Payer: Self-pay | Admitting: Gastroenterology

## 2010-08-01 VITALS — Ht 65.0 in | Wt 151.1 lb

## 2010-08-01 DIAGNOSIS — Z86010 Personal history of colonic polyps: Secondary | ICD-10-CM

## 2010-08-01 MED ORDER — PEG-KCL-NACL-NASULF-NA ASC-C 100 G PO SOLR: 1.0000 | Freq: Once | ORAL | Status: AC

## 2010-08-01 NOTE — Progress Notes (Signed)
Pt signed a medical release form today to get his last colonoscopy report from 6 years ago from Dr Merleen Milliner. Medical release form was given to [facility]. Ulis Rias RN

## 2010-08-09 ENCOUNTER — Ambulatory Visit (AMBULATORY_SURGERY_CENTER): Payer: BC Managed Care – PPO | Admitting: Gastroenterology

## 2010-08-09 ENCOUNTER — Encounter: Payer: Self-pay | Admitting: Gastroenterology

## 2010-08-09 DIAGNOSIS — Z1211 Encounter for screening for malignant neoplasm of colon: Secondary | ICD-10-CM

## 2010-08-09 DIAGNOSIS — D126 Benign neoplasm of colon, unspecified: Secondary | ICD-10-CM

## 2010-08-09 DIAGNOSIS — Z86010 Personal history of colonic polyps: Secondary | ICD-10-CM

## 2010-08-09 MED ORDER — SODIUM CHLORIDE 0.9 % IV SOLN: 500.0000 mL | INTRAVENOUS | Status: DC

## 2010-08-09 NOTE — Patient Instructions (Signed)
5 polyps sent to pathology  Otherwise normal exam  Await pathology for final recommendations.  See green and blue sheets for additional d/c instructions.

## 2010-08-10 ENCOUNTER — Telehealth: Payer: Self-pay

## 2010-08-10 NOTE — Telephone Encounter (Signed)

## 2010-08-15 ENCOUNTER — Telehealth: Payer: Self-pay | Admitting: Gastroenterology

## 2010-08-15 NOTE — Telephone Encounter (Signed)
Forwarded to Dr. Christella Hartigan for Review.

## 2010-08-16 ENCOUNTER — Other Ambulatory Visit: Payer: Self-pay | Admitting: Gastroenterology

## 2010-08-18 ENCOUNTER — Telehealth: Payer: Self-pay | Admitting: Gastroenterology

## 2010-08-18 NOTE — Telephone Encounter (Signed)
Reviewed outside records   colonscopy 10/2003, Dr. Otilio Saber: normal except to 1cm pedunculated TA in hep flexure, removed

## 2010-11-27 ENCOUNTER — Ambulatory Visit (INDEPENDENT_AMBULATORY_CARE_PROVIDER_SITE_OTHER): Payer: BC Managed Care – PPO | Admitting: Internal Medicine

## 2010-11-27 ENCOUNTER — Encounter: Payer: Self-pay | Admitting: Internal Medicine

## 2010-11-27 DIAGNOSIS — I10 Essential (primary) hypertension: Secondary | ICD-10-CM

## 2010-11-27 NOTE — Progress Notes (Signed)
  Subjective:    Patient ID: Mark [name], male    DOB: 08-15-1950, 60 y.o.   MRN: [id number]  HPI  60 year old patient who has a history of treated hypertension. 4 days ago he fell 7-8 feet off a ladder while trimming some hedges. He has had back pain that is steadily improved over the past 4 days. Presently it is quite manageable. His chief complaint today is constipation present over these past 4 days. He was concerned about serious intra-abdominal pathology. He has been taking no analgesics for his back pain and states that he is eating quite well. There's been no abdominal pain nausea or vomiting. He has been passing flatus but no formed bowel movements. No prior history of constipation. He did have a colonoscopy earlier this summer    Review of Systems  Constitutional: Negative for fever, chills, appetite change and fatigue.  HENT: Negative for hearing loss, ear pain, congestion, sore throat, trouble swallowing, neck stiffness, dental problem, voice change and tinnitus.   Eyes: Negative for pain, discharge and visual disturbance.  Respiratory: Negative for cough, chest tightness, wheezing and stridor.   Cardiovascular: Negative for chest pain, palpitations and leg swelling.  Gastrointestinal: Positive for constipation. Negative for nausea, vomiting, abdominal pain, diarrhea, blood in stool and abdominal distention.  Genitourinary: Negative for urgency, hematuria, flank pain, discharge, difficulty urinating and genital sores.  Musculoskeletal: Positive for back pain. Negative for myalgias, joint swelling, arthralgias and gait problem.  Skin: Negative for rash.  Neurological: Negative for dizziness, syncope, speech difficulty, weakness, numbness and headaches.  Hematological: Negative for adenopathy. Does not bruise/bleed easily.  Psychiatric/Behavioral: Negative for behavioral problems and dysphoric mood. The patient is not nervous/anxious.        Objective:   Physical Exam    Constitutional: He is oriented to person, place, and time. He appears well-developed.  HENT:  Head: Normocephalic.  Right Ear: External ear normal.  Left Ear: External ear normal.  Eyes: Conjunctivae and EOM are normal.  Neck: Normal range of motion.  Cardiovascular: Normal rate and normal heart sounds.   Pulmonary/Chest: Breath sounds normal.  Abdominal: Soft. Bowel sounds are normal. He exhibits no distension. There is no tenderness. There is no rebound and no guarding.  Musculoskeletal: Normal range of motion. He exhibits no edema and no tenderness.  Neurological: He is alert and oriented to person, place, and time.  Psychiatric: He has a normal mood and affect. His behavior is normal.          Assessment & Plan:   Posttraumatic back pain improving Constipation. Measures discussed will increase activity as tolerated increase fluid intake and fiber intake. We'll use MiraLax 1-3 times daily until he has a normal bowel movement

## 2010-11-27 NOTE — Patient Instructions (Signed)
It is important that you exercise regularly, at least 20 minutes 3 to 4 times per week.  If you develop chest pain or shortness of breath seek  medical attention.  Drink as much fluid as you  can tolerate over the next few days  High-fiber diet  Use Miralax (Polyethylene glycol) 2-3 times daily until bowek movement

## 2011-03-11 ENCOUNTER — Other Ambulatory Visit: Payer: Self-pay | Admitting: Family Medicine

## 2011-06-09 ENCOUNTER — Other Ambulatory Visit: Payer: Self-pay | Admitting: Family Medicine

## 2011-06-15 ENCOUNTER — Encounter: Payer: Self-pay | Admitting: Cardiology

## 2011-10-27 ENCOUNTER — Other Ambulatory Visit: Payer: Self-pay | Admitting: *Deleted

## 2011-10-27 MED ORDER — AMLODIPINE BESY-BENAZEPRIL HCL 5-10 MG PO CAPS: 1.0000 | ORAL_CAPSULE | Freq: Every day | ORAL | Status: DC

## 2011-11-28 ENCOUNTER — Encounter: Payer: Self-pay | Admitting: Family Medicine

## 2011-11-28 ENCOUNTER — Ambulatory Visit (INDEPENDENT_AMBULATORY_CARE_PROVIDER_SITE_OTHER): Payer: BC Managed Care – PPO | Admitting: Family Medicine

## 2011-11-28 VITALS — BP 120/90 | Temp 97.7°F | Wt 153.0 lb

## 2011-11-28 DIAGNOSIS — Z23 Encounter for immunization: Secondary | ICD-10-CM

## 2011-11-28 DIAGNOSIS — J209 Acute bronchitis, unspecified: Secondary | ICD-10-CM

## 2011-11-28 MED ORDER — ZOLPIDEM TARTRATE 10 MG PO TABS: 10.0000 mg | ORAL_TABLET | Freq: Every evening | ORAL | Status: DC | PRN

## 2011-11-28 MED ORDER — AMLODIPINE BESY-BENAZEPRIL HCL 5-10 MG PO CAPS: 1.0000 | ORAL_CAPSULE | Freq: Every day | ORAL | Status: DC

## 2011-11-28 MED ORDER — METOPROLOL TARTRATE 50 MG PO TABS: 50.0000 mg | ORAL_TABLET | Freq: Two times a day (BID) | ORAL | Status: DC

## 2011-11-28 MED ORDER — HYDROCODONE-HOMATROPINE 5-1.5 MG/5ML PO SYRP: 5.0000 mL | ORAL_SOLUTION | Freq: Four times a day (QID) | ORAL | Status: AC | PRN

## 2011-11-28 NOTE — Progress Notes (Signed)
  Subjective:    Patient ID: Mark [name], male    DOB: [birth year], 61 y.o.   MRN: [id number]  HPI  Patient seen acute visit. 2 week history of cough. Nonproductive. Nonsmoker. Mild nasal congestion diffusely. No fever. No dyspnea. Cough worse at night. Took Delsym cough syrup without relief. He does take amlodipine benazepril for hypertension but on for several years without cough.   Review of Systems  Constitutional: Negative for fever and chills.  HENT: Negative for congestion.   Respiratory: Positive for cough. Negative for shortness of breath and wheezing.        Objective:   Physical Exam  Constitutional: He appears well-developed and well-nourished. No distress.  HENT:  Right Ear: External ear normal.  Left Ear: External ear normal.  Mouth/Throat: Oropharynx is clear and moist.  Neck: Neck supple.  Cardiovascular: Normal rate and regular rhythm.   Pulmonary/Chest: Effort normal and breath sounds normal. No respiratory distress. He has no wheezes. He has no rales.  Lymphadenopathy:    He has no cervical adenopathy.          Assessment & Plan:  Cough. Suspect acute viral bronchitis. Hycodan cough syrup for nighttime use. Touch base one to 2 weeks if cough not resolving. Doubt related ACE inhibitor but consider discontinue of cough persist.

## 2011-11-28 NOTE — Patient Instructions (Addendum)

## 2012-03-06 ENCOUNTER — Other Ambulatory Visit: Payer: Self-pay | Admitting: *Deleted

## 2012-03-06 NOTE — Telephone Encounter (Signed)
Pharmacy requesting a nasal spray that has never been prescribed here.

## 2012-05-30 ENCOUNTER — Encounter: Payer: Self-pay | Admitting: Cardiology

## 2012-10-09 ENCOUNTER — Telehealth: Payer: Self-pay | Admitting: Family Medicine

## 2012-10-09 NOTE — Telephone Encounter (Signed)
There are NO dentists in [place] to my knowledge.

## 2012-10-09 NOTE — Telephone Encounter (Signed)
Pt informed

## 2012-10-09 NOTE — Telephone Encounter (Signed)
Pt states he would like for you to reccommend a dentist in [place]. Pt tried one off [address] and it was not good. Pt lives in [place]. Can you help?

## 2012-11-27 ENCOUNTER — Telehealth: Payer: Self-pay | Admitting: Family Medicine

## 2012-11-27 NOTE — Telephone Encounter (Addendum)
Pt would also like a rx for nasal spray. Pt states dr Caryl Never gave to him 3-4 yrs ago. [facility] [place]  Pt not seen for cpe in several years. Made appt for 12/22

## 2012-11-27 NOTE — Telephone Encounter (Signed)
Pt was informed that there is no nasal spray in his current or history chart of medications

## 2012-11-28 ENCOUNTER — Other Ambulatory Visit: Payer: Self-pay

## 2012-11-28 MED ORDER — FLUTICASONE PROPIONATE 50 MCG/ACT NA SUSP: 2.0000 | Freq: Every day | NASAL | Status: DC

## 2012-11-28 MED ORDER — FLUTICASONE PROPIONATE (INHAL) 50 MCG/BLIST IN AEPB: 1.0000 | INHALATION_SPRAY | Freq: Two times a day (BID) | RESPIRATORY_TRACT | Status: DC

## 2012-12-08 ENCOUNTER — Telehealth: Payer: Self-pay | Admitting: Family Medicine

## 2012-12-08 NOTE — Telephone Encounter (Signed)
Pt is requesting a refill of his metoprolol (LOPRESSOR) 50 MG tablet and his sleeping pill (sorry, I am not sure which one it is and neither is the pt).  Pt states he will come in for an ov if he needs one. Please advise.

## 2012-12-08 NOTE — Telephone Encounter (Signed)
Pt informed that he needs a office visit

## 2012-12-10 ENCOUNTER — Encounter: Payer: Self-pay | Admitting: Family Medicine

## 2012-12-10 ENCOUNTER — Ambulatory Visit (INDEPENDENT_AMBULATORY_CARE_PROVIDER_SITE_OTHER): Payer: BC Managed Care – PPO | Admitting: Family Medicine

## 2012-12-10 VITALS — BP 110/70 | HR 70 | Temp 97.8°F | Wt 157.0 lb

## 2012-12-10 DIAGNOSIS — K047 Periapical abscess without sinus: Secondary | ICD-10-CM

## 2012-12-10 DIAGNOSIS — I10 Essential (primary) hypertension: Secondary | ICD-10-CM

## 2012-12-10 DIAGNOSIS — G4700 Insomnia, unspecified: Secondary | ICD-10-CM

## 2012-12-10 MED ORDER — ZOLPIDEM TARTRATE 10 MG PO TABS: 10.0000 mg | ORAL_TABLET | Freq: Every evening | ORAL | Status: DC | PRN

## 2012-12-10 MED ORDER — METOPROLOL TARTRATE 50 MG PO TABS: 50.0000 mg | ORAL_TABLET | Freq: Two times a day (BID) | ORAL | Status: DC

## 2012-12-10 MED ORDER — FLUTICASONE PROPIONATE 50 MCG/ACT NA SUSP: 2.0000 | Freq: Every day | NASAL | Status: DC

## 2012-12-10 MED ORDER — PENICILLIN V POTASSIUM 500 MG PO TABS: 500.0000 mg | ORAL_TABLET | Freq: Four times a day (QID) | ORAL | Status: DC
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
## 2012-12-10 NOTE — Progress Notes (Signed)
Pre visit review using our clinic review tool, if applicable. No additional management support is needed unless otherwise documented below in the visit note. 

## 2012-12-10 NOTE — Patient Instructions (Signed)

## 2013-01-02 ENCOUNTER — Encounter: Payer: Self-pay | Admitting: *Deleted

## 2013-01-05 ENCOUNTER — Ambulatory Visit (INDEPENDENT_AMBULATORY_CARE_PROVIDER_SITE_OTHER): Payer: BC Managed Care – PPO | Admitting: Family Medicine

## 2013-01-05 ENCOUNTER — Encounter: Payer: Self-pay | Admitting: Family Medicine

## 2013-01-05 VITALS — BP 118/70 | HR 60 | Temp 97.9°F | Ht 65.0 in | Wt 157.0 lb

## 2013-01-05 DIAGNOSIS — I10 Essential (primary) hypertension: Secondary | ICD-10-CM

## 2013-01-05 DIAGNOSIS — Z0000 Encounter for general adult medical examination without abnormal findings: Secondary | ICD-10-CM

## 2013-01-05 DIAGNOSIS — Z23 Encounter for immunization: Secondary | ICD-10-CM

## 2013-01-05 LAB — CBC WITH DIFFERENTIAL/PLATELET
Basophils Relative: 0.2 % (ref 0.0–3.0)
Eosinophils Relative: 4.5 % (ref 0.0–5.0)
Lymphocytes Relative: 27.7 % (ref 12.0–46.0)
Monocytes Absolute: 0.4 10*3/uL (ref 0.1–1.0)
Monocytes Relative: 7.9 % (ref 3.0–12.0)
Neutro Abs: 3.2 10*3/uL (ref 1.4–7.7)
Neutrophils Relative %: 59.7 % (ref 43.0–77.0)
RBC: 5.16 Mil/uL (ref 4.22–5.81)
WBC: 5.4 10*3/uL (ref 4.5–10.5)

## 2013-01-05 LAB — HEPATIC FUNCTION PANEL
ALT: 19 U/L (ref 0–53)
AST: 26 U/L (ref 0–37)
Bilirubin, Direct: 0.2 mg/dL (ref 0.0–0.3)
Total Bilirubin: 0.9 mg/dL (ref 0.3–1.2)

## 2013-01-05 LAB — BASIC METABOLIC PANEL
BUN: 17 mg/dL (ref 6–23)
CO2: 30 mEq/L (ref 19–32)
Calcium: 9.2 mg/dL (ref 8.4–10.5)
Creatinine, Ser: 1 mg/dL (ref 0.4–1.5)
Glucose, Bld: 88 mg/dL (ref 70–99)

## 2013-01-05 LAB — TSH: TSH: 1.04 u[IU]/mL (ref 0.35–5.50)

## 2013-01-05 NOTE — Addendum Note (Signed)
Addended by: Shelby Dubin E on: 01/05/2013 10:40 AM   Modules accepted: Orders
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
## 2013-01-05 NOTE — Progress Notes (Signed)
Pre visit review using our clinic review tool, if applicable. No additional management support is needed unless otherwise documented below in the visit note. 

## 2013-01-05 NOTE — Patient Instructions (Signed)
Continue regular exercise habits Continue with yearly flu vaccine

## 2013-01-06 ENCOUNTER — Telehealth: Payer: Self-pay | Admitting: Family Medicine

## 2013-01-06 NOTE — Telephone Encounter (Signed)
Mark [name] would like a call back to inform her you did indeed receive her fax.

## 2013-01-06 NOTE — Telephone Encounter (Signed)
We have received fax

## 2013-02-24 ENCOUNTER — Other Ambulatory Visit: Payer: Self-pay

## 2013-02-24 MED ORDER — AMLODIPINE BESY-BENAZEPRIL HCL 5-10 MG PO CAPS: 1.0000 | ORAL_CAPSULE | Freq: Every day | ORAL | Status: DC

## 2013-02-27 ENCOUNTER — Telehealth: Payer: Self-pay | Admitting: Family Medicine

## 2013-02-27 NOTE — Telephone Encounter (Signed)
They received echo results, but need ekg results. Would like you to fax to [phone].

## 2013-05-16 ENCOUNTER — Encounter: Payer: Self-pay | Admitting: Gastroenterology

## 2013-05-22 ENCOUNTER — Telehealth: Payer: Self-pay | Admitting: Family Medicine

## 2013-05-22 MED ORDER — PENICILLIN V POTASSIUM 500 MG PO TABS: 500.0000 mg | ORAL_TABLET | Freq: Four times a day (QID) | ORAL | Status: DC

## 2013-05-22 NOTE — Telephone Encounter (Signed)
RX sent to pharmacy  

## 2013-05-22 NOTE — Telephone Encounter (Signed)
Pt request refill of penicillin v potassium (VEETID) 500 MG tablet Pt states his gum is swelling again [facility] [place]

## 2013-06-18 ENCOUNTER — Encounter: Payer: Self-pay | Admitting: Gastroenterology

## 2013-06-23 ENCOUNTER — Telehealth: Payer: Self-pay | Admitting: Family Medicine

## 2013-06-23 NOTE — Telephone Encounter (Signed)
Pt states that dr. Elease Hashimoto had written him a rx for his gout previously, pt states he has a flare up and would like a rx sent to [facility].

## 2013-06-23 NOTE — Telephone Encounter (Signed)
Pt called back and made an appt in the morning

## 2013-06-24 ENCOUNTER — Ambulatory Visit (INDEPENDENT_AMBULATORY_CARE_PROVIDER_SITE_OTHER): Payer: BC Managed Care – PPO | Admitting: Family Medicine

## 2013-06-24 ENCOUNTER — Other Ambulatory Visit: Payer: Self-pay

## 2013-06-24 ENCOUNTER — Encounter: Payer: Self-pay | Admitting: Family Medicine

## 2013-06-24 VITALS — BP 120/70 | HR 60 | Temp 97.9°F | Wt 154.0 lb

## 2013-06-24 DIAGNOSIS — M109 Gout, unspecified: Secondary | ICD-10-CM

## 2013-06-24 MED ORDER — FLUTICASONE PROPIONATE 50 MCG/ACT NA SUSP: 2.0000 | Freq: Every day | NASAL | Status: DC

## 2013-06-24 MED ORDER — ZOLPIDEM TARTRATE 10 MG PO TABS: 10.0000 mg | ORAL_TABLET | Freq: Every evening | ORAL | Status: DC | PRN

## 2013-06-24 MED ORDER — PREDNISONE 10 MG PO TABS: ORAL_TABLET | ORAL | Status: DC

## 2013-06-24 NOTE — Progress Notes (Signed)
Pre visit review using our clinic review tool, if applicable. No additional management support is needed unless otherwise documented below in the visit note. 

## 2013-06-24 NOTE — Patient Instructions (Signed)
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
## 2013-06-30 ENCOUNTER — Telehealth: Payer: Self-pay | Admitting: Family Medicine

## 2013-06-30 MED ORDER — INDOMETHACIN 50 MG PO CAPS: ORAL_CAPSULE | ORAL | Status: DC

## 2013-06-30 NOTE — Telephone Encounter (Signed)
Pt is needing new rx for predniSONE (DELTASONE) 10 MG tablet pt states he has gout flare up, however pt states when he takes this med he can't sleep well, pt wants to know if there is something else he can get for gout but will help him sleep as well  [facility] to [facility] [address]. Pt states he would like to pick up at 12:00 today.

## 2013-06-30 NOTE — Telephone Encounter (Signed)
Rx sent to pharmacy. Left message on pt VM.  

## 2013-06-30 NOTE — Telephone Encounter (Signed)
Stop prednisone.  Indocin 50 mg once every 8 hours PRN gout #30 with one refill.

## 2013-07-06 ENCOUNTER — Other Ambulatory Visit: Payer: Self-pay

## 2013-07-08 ENCOUNTER — Telehealth: Payer: Self-pay

## 2013-07-08 NOTE — Telephone Encounter (Signed)
OK 

## 2013-07-08 NOTE — Telephone Encounter (Signed)
Pt stated that he is having a gout flare up. Patient is wanting a RX for prednisone, is it okay to refill for patient.

## 2013-07-09 MED ORDER — PREDNISONE 10 MG PO TABS: ORAL_TABLET | ORAL | Status: DC

## 2013-07-09 NOTE — Telephone Encounter (Signed)
RX sent to pharmacy  

## 2013-07-31 ENCOUNTER — Ambulatory Visit (AMBULATORY_SURGERY_CENTER): Payer: Self-pay

## 2013-07-31 VITALS — Ht 68.0 in | Wt 150.0 lb

## 2013-07-31 DIAGNOSIS — Z860100 Personal history of colon polyps, unspecified: Secondary | ICD-10-CM

## 2013-07-31 MED ORDER — MOVIPREP 100 G PO SOLR: 1.0000 | Freq: Once | ORAL | Status: DC

## 2013-07-31 NOTE — Progress Notes (Signed)
No allergies to eggs or soy No past problems with anesthesia No home oxygen No diet/weight loss meds  Has email  Emmi instructions given for colonoscopy   PT FEELS HE WAS OVER-SEDATED LAST 2 TIMES HE WAS SEDATED.  HE WANTED TO MAKE CRNA AWARE OF THIS AND REQUESTED THAT HE BE GIVEN "THE RIGHT AMOUNT" THIS TIME.  Please discuss with pt before sedating.  Thank you

## 2013-08-03 ENCOUNTER — Telehealth: Payer: Self-pay | Admitting: Gastroenterology

## 2013-08-03 NOTE — Telephone Encounter (Signed)
Spoke with patient. Patient stats he has already picked up the Moviprep. Patient has no further questions at this time.

## 2013-08-07 ENCOUNTER — Encounter: Payer: Self-pay | Admitting: Gastroenterology

## 2013-08-14 ENCOUNTER — Encounter: Payer: Self-pay | Admitting: Gastroenterology

## 2013-08-14 ENCOUNTER — Ambulatory Visit (AMBULATORY_SURGERY_CENTER): Payer: BC Managed Care – PPO | Admitting: Gastroenterology

## 2013-08-14 VITALS — BP 121/71 | HR 63 | Temp 97.5°F | Resp 19 | Ht 68.0 in | Wt 150.0 lb

## 2013-08-14 DIAGNOSIS — D126 Benign neoplasm of colon, unspecified: Secondary | ICD-10-CM

## 2013-08-14 DIAGNOSIS — Z860100 Personal history of colon polyps, unspecified: Secondary | ICD-10-CM

## 2013-08-14 DIAGNOSIS — Z1211 Encounter for screening for malignant neoplasm of colon: Secondary | ICD-10-CM

## 2013-08-14 MED ORDER — SODIUM CHLORIDE 0.9 % IV SOLN: 500.0000 mL | INTRAVENOUS | Status: DC

## 2013-08-14 NOTE — Patient Instructions (Signed)
YOU HAD AN ENDOSCOPIC PROCEDURE TODAY AT THE [facility]: Refer to the procedure report that was given to you for any specific questions about what was found during the examination.  If the procedure report does not answer your questions, please call your gastroenterologist to clarify.  If you requested that your care partner not be given the details of your procedure findings, then the procedure report has been included in a sealed envelope for you to review at your convenience later.  YOU SHOULD EXPECT: Some feelings of bloating in the abdomen. Passage of more gas than usual.  Walking can help get rid of the air that was put into your GI tract during the procedure and reduce the bloating. If you had a lower endoscopy (such as a colonoscopy or flexible sigmoidoscopy) you may notice spotting of blood in your stool or on the toilet paper. If you underwent a bowel prep for your procedure, then you may not have a normal bowel movement for a few days.  DIET: Your first meal following the procedure should be a light meal and then it is ok to progress to your normal diet.  A half-sandwich or bowl of soup is an example of a good first meal.  Heavy or fried foods are harder to digest and may make you feel nauseous or bloated.  Likewise meals heavy in dairy and vegetables can cause extra gas to form and this can also increase the bloating.  Drink plenty of fluids but you should avoid alcoholic beverages for 24 hours.  ACTIVITY: Your care partner should take you home directly after the procedure.  You should plan to take it easy, moving slowly for the rest of the day.  You can resume normal activity the day after the procedure however you should NOT DRIVE or use heavy machinery for 24 hours (because of the sedation medicines used during the test).    SYMPTOMS TO REPORT IMMEDIATELY: A gastroenterologist can be reached at any hour.  During normal business hours, 8:30 AM to 5:00 PM Monday through Friday,  call [phone].  After hours and on weekends, please call the GI answering service at [phone] who will take a message and have the physician on call contact you.   Following lower endoscopy (colonoscopy or flexible sigmoidoscopy):  Excessive amounts of blood in the stool  Significant tenderness or worsening of abdominal pains  Swelling of the abdomen that is new, acute  Fever of 100F or higher FOLLOW UP: If any biopsies were taken you will be contacted by phone or by letter within the next 1-3 weeks.  Call your gastroenterologist if you have not heard about the biopsies in 3 weeks.  Our staff will call the home number listed on your records the next business day following your procedure to check on you and address any questions or concerns that you may have at that time regarding the information given to you following your procedure. This is a courtesy call and so if there is no answer at the home number and we have not heard from you through the emergency physician on call, we will assume that you have returned to your regular daily activities without incident.  SIGNATURES/CONFIDENTIALITY: You and/or your care partner have signed paperwork which will be entered into your electronic medical record.  These signatures attest to the fact that that the information above on your After Visit Summary has been reviewed and is understood.  Full responsibility of the confidentiality of this discharge   information lies with you and/or your care-partner.  Polyp information given. 

## 2013-08-14 NOTE — Op Note (Signed)
[facility]  [address] [place], [address]   COLONOSCOPY PROCEDURE REPORT  PATIENT: [name], Mark  MR#: [id number] BIRTHDATE: [birth year] , 44  yrs. old GENDER: Male ENDOSCOPIST: Milus Banister, MD PROCEDURE DATE:  08/14/2013 PROCEDURE:   Colonoscopy with snare polypectomy First Screening Colonoscopy - Avg.  risk and is 50 yrs.  old or older - No.  Prior Negative Screening - Now for repeat screening. N/A  History of Adenoma - Now for follow-up colonoscopy & has been > or = to 3 yrs.  Yes hx of adenoma.  Has been 3 or more years since last colonoscopy.  Polyps Removed Today? Yes. ASA CLASS:   Class II INDICATIONS:Colonoscopy 2012 Ardis Hughs, 5 polyps removed 4 of them were adenomatous. MEDICATIONS: MAC sedation, administered by CRNA and Propofol (Diprivan) 130 mg IV  DESCRIPTION OF PROCEDURE:   After the risks benefits and alternatives of the procedure were thoroughly explained, informed consent was obtained.  A digital rectal exam revealed no abnormalities of the rectum.   The LB CF-H180AL Loaner [id number] endoscope was introduced through the anus and advanced to the cecum, which was identified by both the appendix and ileocecal valve. No adverse events experienced.   The quality of the prep was excellent.  The instrument was then slowly withdrawn as the colon was fully examined.   COLON FINDINGS: Two polyps were found, removed and sent to pathology.  These were both sessile, located in rectum and transverse segment, 2-33mm across, removed with cold snare.  The examination was otherwise normal.  Retroflexed views revealed no abnormalities. The time to cecum=3 minutes 17 seconds.  Withdrawal time=8 minutes 57 seconds.  The scope was withdrawn and the procedure completed. COMPLICATIONS: There were no complications.  ENDOSCOPIC IMPRESSION: Two polyps were found, removed and sent to pathology. The examination was otherwise normal.  RECOMMENDATIONS: If the  polyp(s) removed today are proven to be adenomatous (pre-cancerous) polyps, you will need a repeat colonoscopy in 5 years.  You will receive a letter within 1-2 weeks with the results of your biopsy as well as final recommendations.  Please call my office if you have not received a letter after 3 weeks.   eSigned:  Milus Banister, MD 08/14/2013 11:07 AM   cc:  Carolann Littler, MD

## 2013-08-14 NOTE — Progress Notes (Signed)
Procedure ends, to recovery, report given and VSS. 

## 2013-08-14 NOTE — Progress Notes (Signed)
Called to room to assist during endoscopic procedure.  Patient ID and intended procedure confirmed with present staff. Received instructions for my participation in the procedure from the performing physician.  

## 2013-08-17 ENCOUNTER — Telehealth: Payer: Self-pay | Admitting: *Deleted

## 2013-08-17 NOTE — Telephone Encounter (Signed)
  Follow up Call-  Call back number 08/14/2013  Post procedure Call Back phone  # [phone]  Permission to leave phone message Yes    [facility]

## 2013-08-21 ENCOUNTER — Encounter: Payer: Self-pay | Admitting: Gastroenterology

## 2013-11-03 ENCOUNTER — Encounter: Payer: Self-pay | Admitting: Family Medicine

## 2013-11-03 ENCOUNTER — Ambulatory Visit (INDEPENDENT_AMBULATORY_CARE_PROVIDER_SITE_OTHER): Payer: BC Managed Care – PPO | Admitting: Family Medicine

## 2013-11-03 VITALS — BP 118/80 | HR 76 | Temp 98.0°F | Wt 150.0 lb

## 2013-11-03 DIAGNOSIS — G4700 Insomnia, unspecified: Secondary | ICD-10-CM

## 2013-11-03 DIAGNOSIS — Z23 Encounter for immunization: Secondary | ICD-10-CM

## 2013-11-03 DIAGNOSIS — R053 Chronic cough: Secondary | ICD-10-CM

## 2013-11-03 DIAGNOSIS — R05 Cough: Secondary | ICD-10-CM

## 2013-11-03 MED ORDER — AMLODIPINE BESYLATE 5 MG PO TABS: 5.0000 mg | ORAL_TABLET | Freq: Every day | ORAL | Status: DC

## 2013-11-03 MED ORDER — LOSARTAN POTASSIUM 50 MG PO TABS: 50.0000 mg | ORAL_TABLET | Freq: Every day | ORAL | Status: DC

## 2013-11-03 MED ORDER — ZOLPIDEM TARTRATE 10 MG PO TABS: 10.0000 mg | ORAL_TABLET | Freq: Every evening | ORAL | Status: DC | PRN

## 2013-11-03 NOTE — Patient Instructions (Signed)
Stop Lotrel and start Amlodipine 5 mg and Losartan 50 mg

## 2013-11-03 NOTE — Progress Notes (Signed)
Pre visit review using our clinic review tool, if applicable. No additional management support is needed unless otherwise documented below in the visit note. 
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
## 2013-11-23 ENCOUNTER — Ambulatory Visit (INDEPENDENT_AMBULATORY_CARE_PROVIDER_SITE_OTHER)
Admission: RE | Admit: 2013-11-23 | Discharge: 2013-11-23 | Disposition: A | Discharge status: Home / Self Care | Payer: BC Managed Care – PPO | Source: Ambulatory Visit | Attending: Family Medicine | Admitting: Family Medicine

## 2013-11-23 DIAGNOSIS — R05 Cough: Secondary | ICD-10-CM

## 2013-11-23 DIAGNOSIS — R053 Chronic cough: Secondary | ICD-10-CM

## 2013-11-24 ENCOUNTER — Encounter: Payer: Self-pay | Admitting: Family Medicine

## 2013-11-24 ENCOUNTER — Ambulatory Visit (INDEPENDENT_AMBULATORY_CARE_PROVIDER_SITE_OTHER): Payer: BC Managed Care – PPO | Admitting: Family Medicine

## 2013-11-24 VITALS — BP 118/80 | HR 70 | Temp 98.0°F | Wt 154.0 lb

## 2013-11-24 DIAGNOSIS — R05 Cough: Secondary | ICD-10-CM

## 2013-11-24 DIAGNOSIS — I10 Essential (primary) hypertension: Secondary | ICD-10-CM

## 2013-11-24 DIAGNOSIS — R059 Cough, unspecified: Secondary | ICD-10-CM

## 2013-11-24 NOTE — Progress Notes (Signed)
Pre visit review using our clinic review tool, if applicable. No additional management support is needed unless otherwise documented below in the visit note. 

## 2013-11-24 NOTE — Patient Instructions (Signed)
Leave off Norvasc and monitor blood pressure and be in touch if consistently > 150/90
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
## 2013-11-25 ENCOUNTER — Telehealth: Payer: Self-pay | Admitting: Family Medicine

## 2013-11-25 NOTE — Telephone Encounter (Signed)
emmi mailed  °

## 2013-11-26 ENCOUNTER — Ambulatory Visit: Payer: BC Managed Care – PPO | Admitting: Family Medicine

## 2013-11-30 ENCOUNTER — Other Ambulatory Visit: Payer: Self-pay

## 2013-11-30 MED ORDER — METOPROLOL TARTRATE 50 MG PO TABS: 50.0000 mg | ORAL_TABLET | Freq: Two times a day (BID) | ORAL | Status: DC

## 2014-04-13 ENCOUNTER — Encounter: Payer: Self-pay | Admitting: Family Medicine

## 2014-04-13 ENCOUNTER — Ambulatory Visit (INDEPENDENT_AMBULATORY_CARE_PROVIDER_SITE_OTHER): Payer: BC Managed Care – PPO | Admitting: Family Medicine

## 2014-04-13 VITALS — BP 128/78 | HR 73 | Temp 97.7°F | Wt 155.0 lb

## 2014-04-13 DIAGNOSIS — I10 Essential (primary) hypertension: Secondary | ICD-10-CM | POA: Diagnosis not present

## 2014-04-13 MED ORDER — ENALAPRIL MALEATE 10 MG PO TABS: 10.0000 mg | ORAL_TABLET | Freq: Two times a day (BID) | ORAL | Status: DC
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
## 2014-04-13 NOTE — Patient Instructions (Signed)
Be in touch if BP is consistently > 140/90

## 2014-04-13 NOTE — Progress Notes (Signed)
Pre visit review using our clinic review tool, if applicable. No additional management support is needed unless otherwise documented below in the visit note. 

## 2014-05-31 ENCOUNTER — Other Ambulatory Visit: Payer: Self-pay | Admitting: Family Medicine

## 2014-06-03 ENCOUNTER — Other Ambulatory Visit: Payer: Self-pay | Admitting: Family Medicine

## 2014-11-08 ENCOUNTER — Ambulatory Visit (INDEPENDENT_AMBULATORY_CARE_PROVIDER_SITE_OTHER): Payer: BC Managed Care – PPO | Admitting: Family Medicine

## 2014-11-08 ENCOUNTER — Encounter: Payer: Self-pay | Admitting: Family Medicine

## 2014-11-08 VITALS — BP 114/90 | HR 99 | Temp 98.6°F | Wt 154.3 lb

## 2014-11-08 DIAGNOSIS — M10071 Idiopathic gout, right ankle and foot: Secondary | ICD-10-CM | POA: Diagnosis not present

## 2014-11-08 DIAGNOSIS — M109 Gout, unspecified: Secondary | ICD-10-CM

## 2014-11-08 MED ORDER — PREDNISONE 10 MG PO TABS
ORAL_TABLET | ORAL | Status: DC
Start: 2014-11-08 — End: 2015-03-22

## 2014-11-08 MED ORDER — ZOLPIDEM TARTRATE 10 MG PO TABS
10.0000 mg | ORAL_TABLET | Freq: Every evening | ORAL | Status: DC | PRN
Start: 2014-11-08 — End: 2015-03-22

## 2014-11-08 NOTE — Progress Notes (Signed)
Pre visit review using our clinic review tool, if applicable. No additional management support is needed unless otherwise documented below in the visit note. 

## 2014-11-08 NOTE — Patient Instructions (Signed)
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
## 2014-11-11 ENCOUNTER — Other Ambulatory Visit: Payer: Self-pay | Admitting: Family Medicine

## 2014-11-11 ENCOUNTER — Telehealth: Payer: Self-pay | Admitting: Family Medicine

## 2014-11-11 NOTE — Telephone Encounter (Signed)
Spoke to pt and told him that he need to complete the course first and if he still feeling sick to call and make an appt. Also ms Pricilla Holm spoke to him and explain it

## 2014-11-11 NOTE — Telephone Encounter (Signed)
Pt has rx about his med please give him a call  predniSONE (DELTASONE) 10 MG tablet

## 2015-03-21 ENCOUNTER — Other Ambulatory Visit: Payer: Self-pay | Admitting: Family Medicine

## 2015-03-22 ENCOUNTER — Ambulatory Visit (INDEPENDENT_AMBULATORY_CARE_PROVIDER_SITE_OTHER): Payer: BC Managed Care – PPO | Admitting: Family Medicine

## 2015-03-22 ENCOUNTER — Encounter: Payer: Self-pay | Admitting: Family Medicine

## 2015-03-22 VITALS — BP 108/80 | HR 95 | Temp 98.1°F | Ht 68.0 in | Wt 152.6 lb

## 2015-03-22 DIAGNOSIS — M109 Gout, unspecified: Secondary | ICD-10-CM

## 2015-03-22 DIAGNOSIS — R143 Flatulence: Secondary | ICD-10-CM | POA: Diagnosis not present

## 2015-03-22 DIAGNOSIS — G4700 Insomnia, unspecified: Secondary | ICD-10-CM | POA: Diagnosis not present

## 2015-03-22 MED ORDER — COLCHICINE 0.6 MG PO TABS
ORAL_TABLET | ORAL | Status: DC
Start: 2015-03-22 — End: 2017-04-30

## 2015-03-22 MED ORDER — PREDNISONE 20 MG PO TABS: ORAL_TABLET | ORAL | Status: DC

## 2015-03-22 MED ORDER — ZOLPIDEM TARTRATE 10 MG PO TABS: 10.0000 mg | ORAL_TABLET | Freq: Every evening | ORAL | Status: DC | PRN

## 2015-03-22 MED ORDER — FLUTICASONE PROPIONATE 50 MCG/ACT NA SUSP: 2.0000 | Freq: Every day | NASAL | Status: DC
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
## 2015-03-22 NOTE — Progress Notes (Signed)
Pre visit review using our clinic review tool, if applicable. No additional management support is needed unless otherwise documented below in the visit note. 

## 2015-03-22 NOTE — Patient Instructions (Signed)

## 2015-05-16 ENCOUNTER — Other Ambulatory Visit: Payer: Self-pay | Admitting: Family Medicine

## 2015-05-23 ENCOUNTER — Encounter: Payer: Self-pay | Admitting: Family Medicine

## 2015-05-23 ENCOUNTER — Ambulatory Visit (INDEPENDENT_AMBULATORY_CARE_PROVIDER_SITE_OTHER): Payer: BC Managed Care – PPO | Admitting: Family Medicine

## 2015-05-23 VITALS — BP 120/90 | HR 80 | Temp 98.6°F | Ht 68.0 in | Wt 149.0 lb

## 2015-05-23 DIAGNOSIS — J209 Acute bronchitis, unspecified: Secondary | ICD-10-CM

## 2015-05-23 MED ORDER — HYDROCODONE-HOMATROPINE 5-1.5 MG/5ML PO SYRP: 5.0000 mL | ORAL_SOLUTION | Freq: Four times a day (QID) | ORAL | Status: DC | PRN

## 2015-05-23 MED ORDER — ENALAPRIL MALEATE 10 MG PO TABS: 10.0000 mg | ORAL_TABLET | Freq: Two times a day (BID) | ORAL | Status: DC

## 2015-05-23 MED ORDER — METOPROLOL TARTRATE 50 MG PO TABS: ORAL_TABLET | ORAL | Status: DC

## 2015-05-23 MED ORDER — ZOLPIDEM TARTRATE 10 MG PO TABS: 10.0000 mg | ORAL_TABLET | Freq: Every evening | ORAL | Status: DC | PRN

## 2015-05-23 NOTE — Patient Instructions (Signed)
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
## 2015-05-23 NOTE — Progress Notes (Signed)
Pre visit review using our clinic review tool, if applicable. No additional management support is needed unless otherwise documented below in the visit note. 

## 2015-05-31 ENCOUNTER — Telehealth: Payer: Self-pay | Admitting: Family Medicine

## 2015-05-31 NOTE — Telephone Encounter (Signed)
Pt need a new Rx for hydrocodone homatropine syrup.  Pharm:  [facility]

## 2015-05-31 NOTE — Telephone Encounter (Signed)
Last seen on 05/23/15 DX with acute bronchitis and given cough medication then. Please advise on refill.

## 2015-05-31 NOTE — Telephone Encounter (Signed)
Refill once.  Would try to use only qhs for severe cough.

## 2015-06-01 MED ORDER — HYDROCODONE-HOMATROPINE 5-1.5 MG/5ML PO SYRP: 5.0000 mL | ORAL_SOLUTION | Freq: Four times a day (QID) | ORAL | Status: AC | PRN

## 2015-06-01 NOTE — Telephone Encounter (Signed)
Left message with pt making him aware that RX will be up front for him to pick up.

## 2015-06-01 NOTE — Telephone Encounter (Signed)
Printed for signature

## 2015-06-01 NOTE — Addendum Note (Signed)
Addended by: Elio Forget on: 06/01/2015 06:58 AM   Modules accepted: Orders

## 2016-01-13 ENCOUNTER — Other Ambulatory Visit: Payer: Self-pay | Admitting: Family Medicine

## 2016-01-13 NOTE — Telephone Encounter (Signed)
Pt need new Rx for zolpidem  Pt is aware that Dr. Elease Hashimoto is not in the office.  Pt would like to have a call and would like to have it today if possible.

## 2016-01-13 NOTE — Telephone Encounter (Signed)
Okay to refill?  Informed pt that Dr. Elease Hashimoto is out of the office and I would forward his request to him, however I could not guarantee that prescription would be filled today. Pt verblized understanding and would like a call if an when the prescription is ready. Nothing further needed at this time.

## 2016-01-17 MED ORDER — ZOLPIDEM TARTRATE 10 MG PO TABS: 10.0000 mg | ORAL_TABLET | Freq: Every evening | ORAL | 1 refills | Status: DC | PRN

## 2016-01-17 NOTE — Telephone Encounter (Signed)
Refill once OK.  Avoid regular use.

## 2016-01-17 NOTE — Telephone Encounter (Signed)
Called pt and informed him that rx would be sent in to the pharmacy and to also avoid regular use. Called in rx to the pharmacy.

## 2016-03-07 ENCOUNTER — Other Ambulatory Visit: Payer: Self-pay | Admitting: Family Medicine

## 2016-03-07 NOTE — Telephone Encounter (Signed)
Patient has an appointment for physical on 03/21/2016 at 7am.

## 2016-03-07 NOTE — Telephone Encounter (Signed)
Patient's  last physical was 2016, needs to schedule an appointment.

## 2016-03-21 ENCOUNTER — Ambulatory Visit (INDEPENDENT_AMBULATORY_CARE_PROVIDER_SITE_OTHER): Payer: BC Managed Care – PPO | Admitting: Family Medicine

## 2016-03-21 ENCOUNTER — Encounter: Payer: Self-pay | Admitting: Family Medicine

## 2016-03-21 VITALS — BP 118/80 | HR 58 | Temp 97.9°F | Ht 68.25 in | Wt 153.7 lb

## 2016-03-21 DIAGNOSIS — Z0000 Encounter for general adult medical examination without abnormal findings: Secondary | ICD-10-CM | POA: Diagnosis not present

## 2016-03-21 LAB — CBC WITH DIFFERENTIAL/PLATELET
BASOS ABS: 0 10*3/uL (ref 0.0–0.1)
BASOS PCT: 0.2 % (ref 0.0–3.0)
EOS ABS: 0.2 10*3/uL (ref 0.0–0.7)
Eosinophils Relative: 3.8 % (ref 0.0–5.0)
HEMATOCRIT: 50.1 % (ref 39.0–52.0)
Hemoglobin: 17.3 g/dL — ABNORMAL HIGH (ref 13.0–17.0)
LYMPHS ABS: 1.2 10*3/uL (ref 0.7–4.0)
Lymphocytes Relative: 21.7 % (ref 12.0–46.0)
MCHC: 34.5 g/dL (ref 30.0–36.0)
MCV: 95.8 fl (ref 78.0–100.0)
MONO ABS: 0.4 10*3/uL (ref 0.1–1.0)
Monocytes Relative: 7.2 % (ref 3.0–12.0)
NEUTROS ABS: 3.6 10*3/uL (ref 1.4–7.7)
NEUTROS PCT: 67.1 % (ref 43.0–77.0)
Platelets: 203 10*3/uL (ref 150.0–400.0)
RBC: 5.23 Mil/uL (ref 4.22–5.81)
RDW: 13.2 % (ref 11.5–15.5)
WBC: 5.4 10*3/uL (ref 4.0–10.5)

## 2016-03-21 LAB — BASIC METABOLIC PANEL
BUN: 17 mg/dL (ref 6–23)
CHLORIDE: 104 meq/L (ref 96–112)
CO2: 32 meq/L (ref 19–32)
CREATININE: 0.99 mg/dL (ref 0.40–1.50)
Calcium: 9.9 mg/dL (ref 8.4–10.5)
GFR: 80.71 mL/min (ref >60.00)
Glucose, Bld: 94 mg/dL (ref 70–99)
POTASSIUM: 4.4 meq/L (ref 3.5–5.1)
Sodium: 142 mEq/L (ref 135–145)

## 2016-03-21 LAB — HEPATIC FUNCTION PANEL
ALT: 18 U/L (ref 0–53)
AST: 21 U/L (ref 0–37)
Albumin: 4.1 g/dL (ref 3.5–5.2)
Alkaline Phosphatase: 55 U/L (ref 39–117)
BILIRUBIN DIRECT: 0.2 mg/dL (ref 0.0–0.3)
BILIRUBIN TOTAL: 1.1 mg/dL (ref 0.2–1.2)
Total Protein: 6.6 g/dL (ref 6.0–8.3)

## 2016-03-21 LAB — LIPID PANEL
CHOL/HDL RATIO: 4
CHOLESTEROL: 235 mg/dL — AB (ref 0–200)
HDL: 55.8 mg/dL (ref >39.00)
LDL Cholesterol: 153 mg/dL — ABNORMAL HIGH (ref 0–99)
NonHDL: 179.13
TRIGLYCERIDES: 132 mg/dL (ref 0.0–149.0)
VLDL: 26.4 mg/dL (ref 0.0–40.0)

## 2016-03-21 LAB — TSH: TSH: 1.82 u[IU]/mL (ref 0.35–4.50)

## 2016-03-21 LAB — PSA: PSA: 0.48 ng/mL (ref 0.10–4.00)

## 2016-03-21 MED ORDER — ZOLPIDEM TARTRATE 10 MG PO TABS: 10.0000 mg | ORAL_TABLET | Freq: Every evening | ORAL | 1 refills | Status: DC | PRN

## 2016-03-21 NOTE — Progress Notes (Signed)
Pre visit review using our clinic review tool, if applicable. No additional management support is needed unless otherwise documented below in the visit note. 
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
## 2016-03-22 LAB — HEPATITIS C ANTIBODY: HCV Ab: NEGATIVE

## 2016-05-06 ENCOUNTER — Other Ambulatory Visit: Payer: Self-pay | Admitting: Family Medicine

## 2016-06-03 ENCOUNTER — Other Ambulatory Visit: Payer: Self-pay | Admitting: Family Medicine

## 2016-11-08 ENCOUNTER — Ambulatory Visit (INDEPENDENT_AMBULATORY_CARE_PROVIDER_SITE_OTHER): Payer: BC Managed Care – PPO | Admitting: *Deleted

## 2016-11-08 ENCOUNTER — Telehealth: Payer: Self-pay | Admitting: Family Medicine

## 2016-11-08 DIAGNOSIS — Z23 Encounter for immunization: Secondary | ICD-10-CM | POA: Diagnosis not present

## 2016-11-08 MED ORDER — ZOLPIDEM TARTRATE 10 MG PO TABS: 10.0000 mg | ORAL_TABLET | Freq: Every evening | ORAL | 0 refills | Status: DC | PRN

## 2016-11-08 MED ORDER — PNEUMOCOCCAL 13-VAL CONJ VACC IM SUSP
0.5000 mL | INTRAMUSCULAR | Status: AC
  Administered 2016-11-08: 0.5 mL via INTRAMUSCULAR

## 2016-11-08 MED ORDER — FLUTICASONE PROPIONATE 50 MCG/ACT NA SUSP: 2.0000 | Freq: Every day | NASAL | 3 refills | Status: DC

## 2016-11-08 NOTE — Telephone Encounter (Signed)
Refill Fluticasone for one year.  May refill Ambien once- avoid regular use.

## 2016-11-08 NOTE — Telephone Encounter (Signed)
Pt need new Rx's for fluticasone and zolpidem   Pharm:  [facility]

## 2016-11-08 NOTE — Telephone Encounter (Signed)
-  Zolpidem--Last rx given on 3/7 for #30 with 1 ref

## 2016-11-08 NOTE — Telephone Encounter (Signed)
Rx done. 

## 2016-11-29 ENCOUNTER — Other Ambulatory Visit: Payer: Self-pay | Admitting: Family Medicine

## 2017-01-23 ENCOUNTER — Ambulatory Visit: Payer: BC Managed Care – PPO | Admitting: Family Medicine

## 2017-01-23 ENCOUNTER — Encounter: Payer: Self-pay | Admitting: Family Medicine

## 2017-01-23 VITALS — BP 102/62 | HR 72 | Temp 98.0°F | Wt 150.8 lb

## 2017-01-23 DIAGNOSIS — R112 Nausea with vomiting, unspecified: Secondary | ICD-10-CM

## 2017-01-23 DIAGNOSIS — R42 Dizziness and giddiness: Secondary | ICD-10-CM

## 2017-01-23 MED ORDER — MECLIZINE HCL 12.5 MG PO TABS: 12.5000 mg | ORAL_TABLET | Freq: Three times a day (TID) | ORAL | 0 refills | Status: DC | PRN

## 2017-01-23 NOTE — Patient Instructions (Signed)
Vertigo °Vertigo is the feeling that you or your surroundings are moving when they are not. Vertigo can be dangerous if it occurs while you are doing something that could endanger you or others, such as driving. °What are the causes? °This condition is caused by a disturbance in the signals that are sent by your body’s sensory systems to your brain. Different causes of a disturbance can lead to vertigo, including: °· Infections, especially in the inner ear. °· A bad reaction to a drug, or misuse of alcohol and medicines. °· Withdrawal from drugs or alcohol. °· Quickly changing positions, as when lying down or rolling over in bed. °· Migraine headaches. °· Decreased blood flow to the brain. °· Decreased blood pressure. °· Increased pressure in the brain from a head or neck injury, stroke, infection, tumor, or bleeding. °· Central nervous system disorders. ° °What are the signs or symptoms? °Symptoms of this condition usually occur when you move your head or your eyes in different directions. Symptoms may start suddenly, and they usually last for less than a minute. Symptoms may include: °· Loss of balance and falling. °· Feeling like you are spinning or moving. °· Feeling like your surroundings are spinning or moving. °· Nausea and vomiting. °· Blurred vision or double vision. °· Difficulty hearing. °· Slurred speech. °· Dizziness. °· Involuntary eye movement (nystagmus). ° °Symptoms can be mild and cause only slight annoyance, or they can be severe and interfere with daily life. Episodes of vertigo may return (recur) over time, and they are often triggered by certain movements. Symptoms may improve over time. °How is this diagnosed? °This condition may be diagnosed based on medical history and the quality of your nystagmus. Your health care provider may test your eye movements by asking you to quickly change positions to trigger the nystagmus. This may be called the Dix-Hallpike test, head thrust test, or roll test.  You may be referred to a health care provider who specializes in ear, nose, and throat (ENT) problems (otolaryngologist) or a provider who specializes in disorders of the central nervous system (neurologist). °You may have additional testing, including: °· A physical exam. °· Blood tests. °· MRI. °· A CT scan. °· An electrocardiogram (ECG). This records electrical activity in your heart. °· An electroencephalogram (EEG). This records electrical activity in your brain. °· Hearing tests. ° °How is this treated? °Treatment for this condition depends on the cause and the severity of the symptoms. Treatment options include: °· Medicines to treat nausea or vertigo. These are usually used for severe cases. Some medicines that are used to treat other conditions may also reduce or eliminate vertigo symptoms. These include: °? Medicines that control allergies (antihistamines). °? Medicines that control seizures (anticonvulsants). °? Medicines that relieve depression (antidepressants). °? Medicines that relieve anxiety (sedatives). °· Head movements to adjust your inner ear back to normal. If your vertigo is caused by an ear problem, your health care provider may recommend certain movements to correct the problem. °· Surgery. This is rare. ° °Follow these instructions at home: °Safety °· Move slowly.Avoid sudden body or head movements. °· Avoid driving. °· Avoid operating heavy machinery. °· Avoid doing any tasks that would cause danger to you or others if you would have a vertigo episode during the task. °· If you have trouble walking or keeping your balance, try using a cane for stability. If you feel dizzy or unstable, sit down right away. °· Return to your normal activities as told by your   health care provider. Ask your health care provider what activities are safe for you. °General instructions °· Take over-the-counter and prescription medicines only as told by your health care provider. °· Avoid certain positions or  movements as told by your health care provider. °· Drink enough fluid to keep your urine clear or pale yellow. °· Keep all follow-up visits as told by your health care provider. This is important. °Contact a health care provider if: °· Your medicines do not relieve your vertigo or they make it worse. °· You have a fever. °· Your condition gets worse or you develop new symptoms. °· Your family or friends notice any behavioral changes. °· Your nausea or vomiting gets worse. °· You have numbness or a “pins and needles” sensation in part of your body. °Get help right away if: °· You have difficulty moving or speaking. °· You are always dizzy. °· You faint. °· You develop severe headaches. °· You have weakness in your hands, arms, or legs. °· You have changes in your hearing or vision. °· You develop a stiff neck. °· You develop sensitivity to light. °This information is not intended to replace advice given to you by your health care provider. Make sure you discuss any questions you have with your health care provider. °Document Released: 10/11/2004 Document Revised: 06/15/2015 Document Reviewed: 04/26/2014 °Elsevier Interactive Patient Education © 2018 [employer] ° °
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
## 2017-02-01 ENCOUNTER — Other Ambulatory Visit: Payer: Self-pay | Admitting: Family Medicine

## 2017-04-29 ENCOUNTER — Other Ambulatory Visit: Payer: Self-pay | Admitting: Family Medicine

## 2017-04-30 ENCOUNTER — Other Ambulatory Visit: Payer: Self-pay | Admitting: Family Medicine

## 2017-04-30 NOTE — Telephone Encounter (Signed)
LOV 01/23/17 Dr. Elease Hashimoto [facility] [address]

## 2017-04-30 NOTE — Telephone Encounter (Signed)
Refill Ambien once.  Refill Cochicine for 6 months.

## 2017-04-30 NOTE — Telephone Encounter (Signed)
Copied from [place]. Topic: Quick Communication - Rx Refill/Question >> Apr 30, 2017  8:15 AM Robina Ade, Helene Kelp D wrote: Medication: colchicine 0.6 MG tablet, zolpidem (AMBIEN) 10 MG tablet Has the patient contacted their pharmacy? Yes (Agent: If no, request that the patient contact the pharmacy for the refill.) Preferred Pharmacy (with phone number or street name):[id number] [place], [place] - [address] Agent: Please be advised that RX refills may take up to 3 business days. We ask that you follow-up with your pharmacy.

## 2017-05-01 MED ORDER — ZOLPIDEM TARTRATE 10 MG PO TABS: 10.0000 mg | ORAL_TABLET | Freq: Every evening | ORAL | 0 refills | Status: DC | PRN

## 2017-05-01 MED ORDER — COLCHICINE 0.6 MG PO TABS: ORAL_TABLET | ORAL | 5 refills | Status: DC

## 2017-05-25 ENCOUNTER — Other Ambulatory Visit: Payer: Self-pay | Admitting: Family Medicine

## 2017-07-27 ENCOUNTER — Other Ambulatory Visit: Payer: Self-pay | Admitting: Family Medicine

## 2018-02-07 ENCOUNTER — Encounter: Payer: Self-pay | Admitting: Family Medicine

## 2018-02-07 ENCOUNTER — Other Ambulatory Visit: Payer: Self-pay

## 2018-02-07 ENCOUNTER — Ambulatory Visit: Payer: BC Managed Care – PPO | Admitting: Family Medicine

## 2018-02-07 VITALS — BP 118/78 | HR 75 | Temp 97.8°F | Wt 150.9 lb

## 2018-02-07 DIAGNOSIS — G4700 Insomnia, unspecified: Secondary | ICD-10-CM

## 2018-02-07 DIAGNOSIS — Z23 Encounter for immunization: Secondary | ICD-10-CM

## 2018-02-07 DIAGNOSIS — I10 Essential (primary) hypertension: Secondary | ICD-10-CM | POA: Diagnosis not present

## 2018-02-07 DIAGNOSIS — D126 Benign neoplasm of colon, unspecified: Secondary | ICD-10-CM

## 2018-02-07 MED ORDER — ZOLPIDEM TARTRATE 10 MG PO TABS: 10.0000 mg | ORAL_TABLET | Freq: Every evening | ORAL | 0 refills | Status: DC | PRN

## 2018-02-07 MED ORDER — METOPROLOL TARTRATE 50 MG PO TABS: ORAL_TABLET | ORAL | 3 refills | Status: DC

## 2018-02-07 MED ORDER — ENALAPRIL MALEATE 10 MG PO TABS: 10.0000 mg | ORAL_TABLET | Freq: Two times a day (BID) | ORAL | 3 refills | Status: DC
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
## 2018-02-07 NOTE — Patient Instructions (Signed)
Set up physical for next year.

## 2018-02-12 ENCOUNTER — Other Ambulatory Visit: Payer: Self-pay | Admitting: Family Medicine

## 2018-02-12 ENCOUNTER — Other Ambulatory Visit: Payer: Self-pay

## 2018-02-12 MED ORDER — FLUTICASONE PROPIONATE 50 MCG/ACT NA SUSP: 2.0000 | Freq: Every day | NASAL | 3 refills | Status: DC

## 2018-07-04 ENCOUNTER — Encounter: Payer: Self-pay | Admitting: Gastroenterology

## 2018-09-03 ENCOUNTER — Other Ambulatory Visit: Payer: Self-pay | Admitting: Family Medicine

## 2018-09-05 NOTE — Telephone Encounter (Signed)
Last OV 02/07/18, No future OV  Not sure if you want to continue the Colchicine?  You will need to sent the Ambien

## 2018-09-05 NOTE — Telephone Encounter (Signed)
Patient would also like a refill on enalapril (VASOTEC) 10 MG tablet  90 days and zolpidem (AMBIEN) 10 MG tablet  Sent to the pharmacy on file

## 2018-09-05 NOTE — Telephone Encounter (Signed)
See requests.

## 2018-09-06 MED ORDER — ENALAPRIL MALEATE 10 MG PO TABS: 10.0000 mg | ORAL_TABLET | Freq: Two times a day (BID) | ORAL | 3 refills | Status: DC

## 2019-02-04 ENCOUNTER — Encounter: Payer: Self-pay | Admitting: Family Medicine

## 2019-02-04 ENCOUNTER — Ambulatory Visit (INDEPENDENT_AMBULATORY_CARE_PROVIDER_SITE_OTHER): Payer: BC Managed Care – PPO | Admitting: Family Medicine

## 2019-02-04 ENCOUNTER — Other Ambulatory Visit: Payer: Self-pay

## 2019-02-04 VITALS — BP 110/74 | HR 87 | Temp 97.6°F | Ht 66.75 in | Wt 146.6 lb

## 2019-02-04 DIAGNOSIS — Z0000 Encounter for general adult medical examination without abnormal findings: Secondary | ICD-10-CM

## 2019-02-04 DIAGNOSIS — E785 Hyperlipidemia, unspecified: Secondary | ICD-10-CM | POA: Diagnosis not present

## 2019-02-04 DIAGNOSIS — D751 Secondary polycythemia: Secondary | ICD-10-CM | POA: Diagnosis not present

## 2019-02-04 DIAGNOSIS — Z125 Encounter for screening for malignant neoplasm of prostate: Secondary | ICD-10-CM

## 2019-02-04 LAB — LIPID PANEL
Cholesterol: 230 mg/dL — ABNORMAL HIGH (ref 0–200)
HDL: 67.7 mg/dL (ref >39.00)
LDL Cholesterol: 138 mg/dL — ABNORMAL HIGH (ref 0–99)
NonHDL: 161.96
Total CHOL/HDL Ratio: 3
Triglycerides: 122 mg/dL (ref 0.0–149.0)
VLDL: 24.4 mg/dL (ref 0.0–40.0)

## 2019-02-04 LAB — HEPATIC FUNCTION PANEL
ALT: 16 U/L (ref 0–53)
AST: 21 U/L (ref 0–37)
Albumin: 4.7 g/dL (ref 3.5–5.2)
Alkaline Phosphatase: 60 U/L (ref 39–117)
Bilirubin, Direct: 0.2 mg/dL (ref 0.0–0.3)
Total Bilirubin: 1.6 mg/dL — ABNORMAL HIGH (ref 0.2–1.2)
Total Protein: 7.6 g/dL (ref 6.0–8.3)

## 2019-02-04 LAB — CBC WITH DIFFERENTIAL/PLATELET
Basophils Absolute: 0 10*3/uL (ref 0.0–0.1)
Basophils Relative: 0.3 % (ref 0.0–3.0)
Eosinophils Absolute: 0.1 10*3/uL (ref 0.0–0.7)
Eosinophils Relative: 2.1 % (ref 0.0–5.0)
HCT: 53.3 % — ABNORMAL HIGH (ref 39.0–52.0)
Hemoglobin: 18.3 g/dL (ref 13.0–17.0)
Lymphocytes Relative: 30.6 % (ref 12.0–46.0)
Lymphs Abs: 1.5 10*3/uL (ref 0.7–4.0)
MCHC: 34.4 g/dL (ref 30.0–36.0)
MCV: 96.2 fl (ref 78.0–100.0)
Monocytes Absolute: 0.3 10*3/uL (ref 0.1–1.0)
Monocytes Relative: 7.1 % (ref 3.0–12.0)
Neutro Abs: 2.9 10*3/uL (ref 1.4–7.7)
Neutrophils Relative %: 59.9 % (ref 43.0–77.0)
Platelets: 209 10*3/uL (ref 150.0–400.0)
RBC: 5.54 Mil/uL (ref 4.22–5.81)
RDW: 13.1 % (ref 11.5–15.5)
WBC: 4.9 10*3/uL (ref 4.0–10.5)

## 2019-02-04 LAB — BASIC METABOLIC PANEL
BUN: 18 mg/dL (ref 6–23)
CO2: 30 mEq/L (ref 19–32)
Calcium: 9.8 mg/dL (ref 8.4–10.5)
Chloride: 100 mEq/L (ref 96–112)
Creatinine, Ser: 1.09 mg/dL (ref 0.40–1.50)
GFR: 67.36 mL/min (ref >60.00)
Glucose, Bld: 94 mg/dL (ref 70–99)
Potassium: 4.5 mEq/L (ref 3.5–5.1)
Sodium: 139 mEq/L (ref 135–145)

## 2019-02-04 LAB — TSH: TSH: 1.23 u[IU]/mL (ref 0.35–4.50)

## 2019-02-04 LAB — PSA: PSA: 0.49 ng/mL (ref 0.10–4.00)

## 2019-02-04 NOTE — Addendum Note (Signed)
Addended by: [name] on: 02/04/2019 09:30 PM   Modules accepted: Orders
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
## 2019-02-04 NOTE — Patient Instructions (Signed)

## 2019-02-05 MED ORDER — ATORVASTATIN CALCIUM 10 MG PO TABS: 10.0000 mg | ORAL_TABLET | Freq: Every day | ORAL | 0 refills | Status: DC

## 2019-02-05 NOTE — Addendum Note (Signed)
Addended by: Agnes Lawrence on: 02/05/2019 02:45 PM   Modules accepted: Orders

## 2019-02-11 ENCOUNTER — Encounter: Payer: Self-pay | Admitting: Gastroenterology

## 2019-02-12 ENCOUNTER — Other Ambulatory Visit: Payer: Self-pay

## 2019-02-12 ENCOUNTER — Other Ambulatory Visit (INDEPENDENT_AMBULATORY_CARE_PROVIDER_SITE_OTHER): Payer: BC Managed Care – PPO

## 2019-02-12 DIAGNOSIS — E785 Hyperlipidemia, unspecified: Secondary | ICD-10-CM | POA: Diagnosis not present

## 2019-02-12 DIAGNOSIS — D751 Secondary polycythemia: Secondary | ICD-10-CM

## 2019-02-12 LAB — HEPATIC FUNCTION PANEL
ALT: 12 U/L (ref 0–53)
AST: 16 U/L (ref 0–37)
Albumin: 4.5 g/dL (ref 3.5–5.2)
Alkaline Phosphatase: 54 U/L (ref 39–117)
Bilirubin, Direct: 0.3 mg/dL (ref 0.0–0.3)
Total Bilirubin: 1.5 mg/dL — ABNORMAL HIGH (ref 0.2–1.2)
Total Protein: 6.9 g/dL (ref 6.0–8.3)

## 2019-02-12 LAB — LIPID PANEL
Cholesterol: 196 mg/dL (ref 0–200)
HDL: 63.9 mg/dL (ref >39.00)
LDL Cholesterol: 119 mg/dL — ABNORMAL HIGH (ref 0–99)
NonHDL: 131.92
Total CHOL/HDL Ratio: 3
Triglycerides: 67 mg/dL (ref 0.0–149.0)
VLDL: 13.4 mg/dL (ref 0.0–40.0)

## 2019-02-13 ENCOUNTER — Telehealth: Payer: Self-pay | Admitting: Family Medicine

## 2019-02-13 NOTE — Telephone Encounter (Signed)
Pt would like a call about his lab results from 02/12/19 once they are in. Pt can be reached at [phone]

## 2019-02-16 LAB — ERYTHROPOIETIN: Erythropoietin: 9.7 m[IU]/mL (ref 2.6–18.5)

## 2019-02-16 NOTE — Telephone Encounter (Signed)
Called patient and gave him lab result. See message in lab results.

## 2019-02-25 ENCOUNTER — Other Ambulatory Visit: Payer: Self-pay

## 2019-02-25 ENCOUNTER — Ambulatory Visit (AMBULATORY_SURGERY_CENTER): Payer: Self-pay | Admitting: *Deleted

## 2019-02-25 VITALS — Temp 98.0°F | Ht 66.75 in | Wt 149.0 lb

## 2019-02-25 DIAGNOSIS — Z01818 Encounter for other preprocedural examination: Secondary | ICD-10-CM

## 2019-02-25 DIAGNOSIS — Z86010 Personal history of colonic polyps: Secondary | ICD-10-CM

## 2019-02-25 NOTE — Progress Notes (Signed)
Pt is aware that care partner will wait in the car during procedure; if they feel like they will be too hot or cold to wait in the car; they may wait in the 4 th floor lobby. Patient is aware to bring only one care partner. We want them to wear a mask (we do not have any that we can provide them), practice social distancing, and we will check their temperatures when they get here.  I did remind the patient that their care partner needs to stay in the parking lot the entire time and have a cell phone available, we will call them when the pt is ready for discharge. Patient will wear mask into building.   covid test 03-06-19 at 10:10 am  No egg or soy allergy  No home oxygen use or problems with anesthesia  No medications for weight loss taken  No trouble moving neck

## 2019-03-03 ENCOUNTER — Encounter: Payer: Self-pay | Admitting: Gastroenterology

## 2019-03-06 ENCOUNTER — Other Ambulatory Visit: Payer: Self-pay | Admitting: Gastroenterology

## 2019-03-06 ENCOUNTER — Ambulatory Visit (INDEPENDENT_AMBULATORY_CARE_PROVIDER_SITE_OTHER): Payer: BC Managed Care – PPO

## 2019-03-06 DIAGNOSIS — Z1159 Encounter for screening for other viral diseases: Secondary | ICD-10-CM

## 2019-03-07 LAB — SARS CORONAVIRUS 2 (TAT 6-24 HRS): SARS Coronavirus 2: NEGATIVE

## 2019-03-11 ENCOUNTER — Ambulatory Visit (AMBULATORY_SURGERY_CENTER): Payer: BC Managed Care – PPO | Admitting: Gastroenterology

## 2019-03-11 ENCOUNTER — Encounter: Payer: Self-pay | Admitting: Gastroenterology

## 2019-03-11 ENCOUNTER — Other Ambulatory Visit: Payer: Self-pay

## 2019-03-11 VITALS — BP 106/66 | HR 62 | Temp 96.8°F | Resp 13 | Ht 66.0 in | Wt 149.0 lb

## 2019-03-11 DIAGNOSIS — D128 Benign neoplasm of rectum: Secondary | ICD-10-CM | POA: Diagnosis not present

## 2019-03-11 DIAGNOSIS — Z86010 Personal history of colonic polyps: Secondary | ICD-10-CM

## 2019-03-11 DIAGNOSIS — D124 Benign neoplasm of descending colon: Secondary | ICD-10-CM | POA: Diagnosis not present

## 2019-03-11 DIAGNOSIS — D123 Benign neoplasm of transverse colon: Secondary | ICD-10-CM

## 2019-03-11 MED ORDER — SODIUM CHLORIDE 0.9 % IV SOLN: 500.0000 mL | Freq: Once | INTRAVENOUS | Status: DC

## 2019-03-11 NOTE — Progress Notes (Signed)
Called to room to assist during endoscopic procedure.  Patient ID and intended procedure confirmed with present staff. Received instructions for my participation in the procedure from the performing physician.  

## 2019-03-11 NOTE — Progress Notes (Signed)
Pt tolerated well. VSS. Awake and to recovery. 

## 2019-03-11 NOTE — Patient Instructions (Signed)
YOU HAD AN ENDOSCOPIC PROCEDURE TODAY AT THE [facility]:   Refer to the procedure report that was given to you for any specific questions about what was found during the examination.  If the procedure report does not answer your questions, please call your gastroenterologist to clarify.  If you requested that your care partner not be given the details of your procedure findings, then the procedure report has been included in a sealed envelope for you to review at your convenience later.  **Handout given on polyps**   YOU SHOULD EXPECT: Some feelings of bloating in the abdomen. Passage of more gas than usual.  Walking can help get rid of the air that was put into your GI tract during the procedure and reduce the bloating. If you had a lower endoscopy (such as a colonoscopy or flexible sigmoidoscopy) you may notice spotting of blood in your stool or on the toilet paper. If you underwent a bowel prep for your procedure, you may not have a normal bowel movement for a few days.  Please Note:  You might notice some irritation and congestion in your nose or some drainage.  This is from the oxygen used during your procedure.  There is no need for concern and it should clear up in a day or so.  SYMPTOMS TO REPORT IMMEDIATELY:   Following lower endoscopy (colonoscopy or flexible sigmoidoscopy):  Excessive amounts of blood in the stool  Significant tenderness or worsening of abdominal pains  Swelling of the abdomen that is new, acute  Fever of 100F or higher   For urgent or emergent issues, a gastroenterologist can be reached at any hour by calling [phone].   DIET:  We do recommend a small meal at first, but then you may proceed to your regular diet.  Drink plenty of fluids but you should avoid alcoholic beverages for 24 hours.  ACTIVITY:  You should plan to take it easy for the rest of today and you should NOT DRIVE or use heavy machinery until tomorrow (because of the sedation  medicines used during the test).    FOLLOW UP: Our staff will call the number listed on your records 48-72 hours following your procedure to check on you and address any questions or concerns that you may have regarding the information given to you following your procedure. If we do not reach you, we will leave a message.  We will attempt to reach you two times.  During this call, we will ask if you have developed any symptoms of COVID 19. If you develop any symptoms (ie: fever, flu-like symptoms, shortness of breath, cough etc.) before then, please call [phone].  If you test positive for Covid 19 in the 2 weeks post procedure, please call and report this information to us.    If any biopsies were taken you will be contacted by phone or by letter within the next 1-3 weeks.  Please call us at [phone] if you have not heard about the biopsies in 3 weeks.    SIGNATURES/CONFIDENTIALITY: You and/or your care partner have signed paperwork which will be entered into your electronic medical record.  These signatures attest to the fact that that the information above on your After Visit Summary has been reviewed and is understood.  Full responsibility of the confidentiality of this discharge information lies with you and/or your care-partner. 

## 2019-03-11 NOTE — Progress Notes (Signed)
Temp JB  VS KA   Pt's states no medical or surgical changes since previsit or office visit.

## 2019-03-11 NOTE — Op Note (Signed)
[facility] Patient Name: Mark [name] Procedure Date: 03/11/2019 8:30 AM MRN: [id number] Endoscopist: Milus Banister , MD Age: 69 Referring MD:  Date of Birth: 10/13/1950 Gender: Male Account #: [id number] Procedure:                Colonoscopy Indications:              High risk colon cancer surveillance: Personal                            history of colonic polyps Medicines:                Monitored Anesthesia Care Procedure:                Pre-Anesthesia Assessment:                           - Prior to the procedure, a History and Physical                            was performed, and patient medications and                            allergies were reviewed. The patient's tolerance of                            previous anesthesia was also reviewed. The risks                            and benefits of the procedure and the sedation                            options and risks were discussed with the patient.                            All questions were answered, and informed consent                            was obtained. Prior Anticoagulants: The patient has                            taken no previous anticoagulant or antiplatelet                            agents. ASA Grade Assessment: II - A patient with                            mild systemic disease. After reviewing the risks                            and benefits, the patient was deemed in                            satisfactory condition to undergo the procedure.  After obtaining informed consent, the colonoscope                            was passed under direct vision. Throughout the                            procedure, the patient's blood pressure, pulse, and                            oxygen saturations were monitored continuously. The                            Colonoscope was introduced through the anus and                            advanced to the the cecum, identified by                          appendiceal orifice and ileocecal valve. The                            colonoscopy was performed without difficulty. The                            patient tolerated the procedure well. The quality                            of the bowel preparation was good. The ileocecal                            valve, appendiceal orifice, and rectum were                            photographed. Scope In: 8:31:45 AM Scope Out: [id number] AM Scope Withdrawal Time: 0 hours 12 minutes 48 seconds  Total Procedure Duration: 0 hours 17 minutes 33 seconds  Findings:                 Five sessile and semi-pedunculated polyps were                            found in the recto-sigmoid colon, descending colon                            and transverse colon. The polyps were 2 to 11 mm in                            size. These polyps were removed with a cold snare.                            Resection and retrieval were complete. The largest                            of these was 31mm, semipedunculated, located in  proximal transverse segment.                           The exam was otherwise without abnormality on                            direct and retroflexion views. Complications:            No immediate complications. Estimated blood loss:                            None. Estimated Blood Loss:     Estimated blood loss: none. Impression:               - Five 2 to 11 mm polyps at the recto-sigmoid                            colon, in the descending colon and in the                            transverse colon, removed with a cold snare.                            Resected and retrieved.                           - The examination was otherwise normal on direct                            and retroflexion views. Recommendation:           - Patient has a contact number available for                            emergencies. The signs and symptoms of potential                             delayed complications were discussed with the                            patient. Return to normal activities tomorrow.                            Written discharge instructions were provided to the                            patient.                           - Resume previous diet.                           - Continue present medications.                           - Await pathology results. Milus Banister, MD 03/11/2019 8:52:09 AM This report has been signed electronically.

## 2019-03-13 ENCOUNTER — Telehealth: Payer: Self-pay

## 2019-03-13 NOTE — Telephone Encounter (Signed)
  Follow up Call-  Call back number 03/11/2019  Post procedure Call Back phone  # [phone]  Permission to leave phone message Yes  Some recent data might be hidden     Patient questions:  Do you have a fever, pain , or abdominal swelling? No. Pain Score  0 *  Have you tolerated food without any problems? Yes.    Have you been able to return to your normal activities? Yes.    Do you have any questions about your discharge instructions: Diet   No. Medications  No. Follow up visit  No.  Do you have questions or concerns about your Care? No.  Actions: * If pain score is 4 or above: No action needed, pain <4.  1. Have you developed a fever since your procedure? no  2.   Have you had an respiratory symptoms (SOB or cough) since your procedure? no  3.   Have you tested positive for COVID 19 since your procedure no  4.   Have you had any family members/close contacts diagnosed with the COVID 19 since your procedure?  No    If yes to any of these questions please route to Joylene John, RN and Alphonsa Gin, RN.

## 2019-03-16 ENCOUNTER — Encounter: Payer: Self-pay | Admitting: Gastroenterology

## 2019-04-14 ENCOUNTER — Other Ambulatory Visit: Payer: BC Managed Care – PPO

## 2019-05-15 ENCOUNTER — Telehealth: Payer: Self-pay

## 2019-05-15 ENCOUNTER — Other Ambulatory Visit: Payer: Self-pay

## 2019-05-15 ENCOUNTER — Inpatient Hospital Stay (HOSPITAL_COMMUNITY)
Admission: EM | Admit: 2019-05-15 | Discharge: 2019-05-15 | DRG: 313 | Discharge status: Against Medical Advice | Payer: BC Managed Care – PPO | Attending: Internal Medicine | Admitting: Internal Medicine

## 2019-05-15 ENCOUNTER — Encounter (HOSPITAL_COMMUNITY): Payer: Self-pay | Admitting: Emergency Medicine

## 2019-05-15 ENCOUNTER — Emergency Department (HOSPITAL_COMMUNITY): Payer: BC Managed Care – PPO

## 2019-05-15 DIAGNOSIS — Z7982 Long term (current) use of aspirin: Secondary | ICD-10-CM | POA: Diagnosis not present

## 2019-05-15 DIAGNOSIS — Z5329 Procedure and treatment not carried out because of patient's decision for other reasons: Secondary | ICD-10-CM | POA: Diagnosis not present

## 2019-05-15 DIAGNOSIS — I10 Essential (primary) hypertension: Secondary | ICD-10-CM | POA: Diagnosis present

## 2019-05-15 DIAGNOSIS — H9190 Unspecified hearing loss, unspecified ear: Secondary | ICD-10-CM | POA: Diagnosis present

## 2019-05-15 DIAGNOSIS — H269 Unspecified cataract: Secondary | ICD-10-CM | POA: Diagnosis present

## 2019-05-15 DIAGNOSIS — I4891 Unspecified atrial fibrillation: Secondary | ICD-10-CM | POA: Diagnosis present

## 2019-05-15 DIAGNOSIS — M109 Gout, unspecified: Secondary | ICD-10-CM | POA: Diagnosis present

## 2019-05-15 DIAGNOSIS — R072 Precordial pain: Secondary | ICD-10-CM | POA: Diagnosis not present

## 2019-05-15 DIAGNOSIS — D179 Benign lipomatous neoplasm, unspecified: Secondary | ICD-10-CM | POA: Diagnosis present

## 2019-05-15 DIAGNOSIS — Z79899 Other long term (current) drug therapy: Secondary | ICD-10-CM | POA: Diagnosis not present

## 2019-05-15 DIAGNOSIS — Z823 Family history of stroke: Secondary | ICD-10-CM | POA: Diagnosis not present

## 2019-05-15 DIAGNOSIS — R079 Chest pain, unspecified: Secondary | ICD-10-CM

## 2019-05-15 DIAGNOSIS — Z8249 Family history of ischemic heart disease and other diseases of the circulatory system: Secondary | ICD-10-CM

## 2019-05-15 DIAGNOSIS — Z803 Family history of malignant neoplasm of breast: Secondary | ICD-10-CM

## 2019-05-15 DIAGNOSIS — Z20822 Contact with and (suspected) exposure to covid-19: Secondary | ICD-10-CM | POA: Diagnosis present

## 2019-05-15 LAB — CBC
HCT: 50.9 % (ref 39.0–52.0)
HCT: 52.7 % — ABNORMAL HIGH (ref 39.0–52.0)
Hemoglobin: 17.2 g/dL — ABNORMAL HIGH (ref 13.0–17.0)
Hemoglobin: 17.6 g/dL — ABNORMAL HIGH (ref 13.0–17.0)
MCH: 32.7 pg (ref 26.0–34.0)
MCH: 32.8 pg (ref 26.0–34.0)
MCHC: 33.8 g/dL (ref 30.0–36.0)
MCHC: 33.4 g/dL (ref 30.0–36.0)
MCV: 96.8 fL (ref 80.0–100.0)
MCV: 98.1 fL (ref 80.0–100.0)
Platelets: 195 10*3/uL (ref 150–400)
Platelets: 188 10*3/uL (ref 150–400)
RBC: 5.26 MIL/uL (ref 4.22–5.81)
RBC: 5.37 MIL/uL (ref 4.22–5.81)
RDW: 12.4 % (ref 11.5–15.5)
RDW: 12.4 % (ref 11.5–15.5)
WBC: 5.3 10*3/uL (ref 4.0–10.5)
WBC: 4.8 10*3/uL (ref 4.0–10.5)
nRBC: 0 % (ref 0.0–0.2)
nRBC: 0 % (ref 0.0–0.2)

## 2019-05-15 LAB — TROPONIN I (HIGH SENSITIVITY)
Troponin I (High Sensitivity): 3 ng/L (ref <18)
Troponin I (High Sensitivity): 3 ng/L (ref <18)
Troponin I (High Sensitivity): 3 ng/L (ref <18)

## 2019-05-15 LAB — BASIC METABOLIC PANEL
Anion gap: 8 (ref 5–15)
BUN: 19 mg/dL (ref 8–23)
CO2: 28 mmol/L (ref 22–32)
Calcium: 9.4 mg/dL (ref 8.9–10.3)
Chloride: 103 mmol/L (ref 98–111)
Creatinine, Ser: 1.2 mg/dL (ref 0.61–1.24)
GFR calc Af Amer: >60 mL/min (ref >60)
GFR calc non Af Amer: >60 mL/min (ref >60)
Glucose, Bld: 105 mg/dL — ABNORMAL HIGH (ref 70–99)
Potassium: 4.8 mmol/L (ref 3.5–5.1)
Sodium: 139 mmol/L (ref 135–145)

## 2019-05-15 LAB — LIPID PANEL
Cholesterol: 236 mg/dL — ABNORMAL HIGH (ref 0–200)
HDL: 67 mg/dL (ref >40)
LDL Cholesterol: 153 mg/dL — ABNORMAL HIGH (ref 0–99)
Total CHOL/HDL Ratio: 3.5 RATIO
Triglycerides: 78 mg/dL (ref <150)
VLDL: 16 mg/dL (ref 0–40)

## 2019-05-15 LAB — HIV ANTIBODY (ROUTINE TESTING W REFLEX): HIV Screen 4th Generation wRfx: NONREACTIVE

## 2019-05-15 LAB — PHOSPHORUS: Phosphorus: 3.4 mg/dL (ref 2.5–4.6)

## 2019-05-15 LAB — MAGNESIUM: Magnesium: 2.1 mg/dL (ref 1.7–2.4)

## 2019-05-15 LAB — CREATININE, SERUM
Creatinine, Ser: 0.97 mg/dL (ref 0.61–1.24)
GFR calc Af Amer: >60 mL/min (ref >60)
GFR calc non Af Amer: >60 mL/min (ref >60)

## 2019-05-15 LAB — HEMOGLOBIN A1C
Hgb A1c MFr Bld: 5.4 % (ref 4.8–5.6)
Mean Plasma Glucose: 108.28 mg/dL

## 2019-05-15 LAB — RESPIRATORY PANEL BY RT PCR (FLU A&B, COVID)
Influenza A by PCR: NEGATIVE
Influenza B by PCR: NEGATIVE
SARS Coronavirus 2 by RT PCR: NEGATIVE

## 2019-05-15 LAB — TSH: TSH: 1.43 u[IU]/mL (ref 0.350–4.500)

## 2019-05-15 MED ORDER — ONDANSETRON HCL 4 MG PO TABS: 4.0000 mg | ORAL_TABLET | Freq: Four times a day (QID) | ORAL | Status: DC | PRN

## 2019-05-15 MED ORDER — OXYCODONE HCL 5 MG PO TABS: 5.0000 mg | ORAL_TABLET | ORAL | Status: DC | PRN

## 2019-05-15 MED ORDER — ACETAMINOPHEN 325 MG PO TABS: 650.0000 mg | ORAL_TABLET | Freq: Four times a day (QID) | ORAL | Status: DC | PRN

## 2019-05-15 MED ORDER — ENALAPRIL MALEATE 5 MG PO TABS: 10.0000 mg | ORAL_TABLET | Freq: Two times a day (BID) | ORAL | Status: DC

## 2019-05-15 MED ORDER — SODIUM CHLORIDE 0.9% FLUSH: 3.0000 mL | Freq: Once | INTRAVENOUS | Status: DC

## 2019-05-15 MED ORDER — ACETAMINOPHEN 650 MG RE SUPP: 650.0000 mg | Freq: Four times a day (QID) | RECTAL | Status: DC | PRN

## 2019-05-15 MED ORDER — ASPIRIN 81 MG PO TABS: 81.0000 mg | ORAL_TABLET | Freq: Every day | ORAL | Status: DC | PRN

## 2019-05-15 MED ORDER — MORPHINE SULFATE (PF) 2 MG/ML IV SOLN: 2.0000 mg | INTRAVENOUS | Status: DC | PRN

## 2019-05-15 MED ORDER — ONDANSETRON HCL 4 MG/2ML IJ SOLN: 4.0000 mg | Freq: Four times a day (QID) | INTRAMUSCULAR | Status: DC | PRN

## 2019-05-15 MED ORDER — ENOXAPARIN SODIUM 40 MG/0.4ML ~~LOC~~ SOLN: 40.0000 mg | SUBCUTANEOUS | Status: DC

## 2019-05-15 NOTE — Discharge Instructions (Addendum)
You are leaving [facility].  If you change your mind on admission, have new or worsening symptoms please seek reevaluation emergency department immediately.
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
## 2019-05-15 NOTE — ED Notes (Signed)
Pt desires to leave AMA. MD notified of patients decision. Pt advised of risks of leaving AMA. Pt verbalizes understanding.

## 2019-05-15 NOTE — Telephone Encounter (Signed)
Noted  

## 2019-05-15 NOTE — ED Triage Notes (Addendum)
Pt reports L sided chest pain around 4am this morning that awoke him from sleep, reports pain lasted approx 1 hour. Denies sob. Also endorses diaphoresis at time of pain, resp e/u, nad.  Pt endorses hx of afib
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
## 2019-05-29 ENCOUNTER — Encounter: Payer: Self-pay | Admitting: Cardiology

## 2019-05-29 ENCOUNTER — Ambulatory Visit (INDEPENDENT_AMBULATORY_CARE_PROVIDER_SITE_OTHER): Payer: BC Managed Care – PPO | Admitting: Cardiology

## 2019-05-29 ENCOUNTER — Other Ambulatory Visit: Payer: Self-pay

## 2019-05-29 VITALS — BP 100/72 | HR 80 | Ht 66.0 in | Wt 147.0 lb

## 2019-05-29 DIAGNOSIS — R079 Chest pain, unspecified: Secondary | ICD-10-CM | POA: Diagnosis not present

## 2019-05-29 DIAGNOSIS — I482 Chronic atrial fibrillation, unspecified: Secondary | ICD-10-CM | POA: Diagnosis not present

## 2019-05-29 DIAGNOSIS — I10 Essential (primary) hypertension: Secondary | ICD-10-CM

## 2019-05-29 DIAGNOSIS — E785 Hyperlipidemia, unspecified: Secondary | ICD-10-CM

## 2019-05-29 DIAGNOSIS — Z79899 Other long term (current) drug therapy: Secondary | ICD-10-CM

## 2019-05-29 MED ORDER — RIVAROXABAN 20 MG PO TABS
20.0000 mg | ORAL_TABLET | Freq: Every day | ORAL | 3 refills | Status: DC
Start: 2019-05-29 — End: 2020-05-12

## 2019-05-29 NOTE — Patient Instructions (Signed)
Medication Instructions:   START  Xarelto 20 mg daily  *If you need a refill on your cardiac medications before your next appointment, please call your pharmacy*  Lab Work: Your physician recommends that you return for lab work in 1 MONTH:   [place] If you have labs (blood work) drawn today and your tests are completely normal, you will receive your results only by: Marland Kitchen MyChart Message (if you have MyChart) OR . A paper copy in the mail If you have any lab test that is abnormal or we need to change your treatment, we will call you to review the results.  Testing/Procedures: NONE ordered at this time of appointment   Follow-Up: At [facility], you and your health needs are our priority.  As part of our continuing mission to provide you with exceptional heart care, we have created designated Provider Care Teams.  These Care Teams include your primary Cardiologist (physician) and Advanced Practice Providers (APPs -  Physician Assistants and Nurse Practitioners) who all work together to provide you with the care you need, when you need it.  We recommend signing up for the patient portal called "MyChart".  Sign up information is provided on this After Visit Summary.  MyChart is used to connect with patients for Virtual Visits (Telemedicine).  Patients are able to view lab/test results, encounter notes, upcoming appointments, etc.  Non-urgent messages can be sent to your provider as well.   To learn more about what you can do with MyChart, go to [email or web address].    Your next appointment:   6 month(s)  The format for your next appointment:   In Person  Provider:   Minus Breeding, MD  Other Instructions

## 2019-06-30 LAB — CBC
Hematocrit: 51.5 % — ABNORMAL HIGH (ref 37.5–51.0)
Hemoglobin: 17 g/dL (ref 13.0–17.7)
MCH: 31.8 pg (ref 26.6–33.0)
MCHC: 33 g/dL (ref 31.5–35.7)
MCV: 96 fL (ref 79–97)
Platelets: 197 10*3/uL (ref 150–450)
RBC: 5.35 x10E6/uL (ref 4.14–5.80)
RDW: 12.3 % (ref 11.6–15.4)
WBC: 4.6 10*3/uL (ref 3.4–10.8)

## 2019-08-18 ENCOUNTER — Ambulatory Visit: Payer: BC Managed Care – PPO | Admitting: Family Medicine

## 2019-08-25 ENCOUNTER — Ambulatory Visit: Payer: BC Managed Care – PPO | Admitting: Family Medicine

## 2019-08-25 ENCOUNTER — Other Ambulatory Visit: Payer: Self-pay

## 2019-08-25 ENCOUNTER — Encounter: Payer: Self-pay | Admitting: Family Medicine

## 2019-08-25 VITALS — BP 118/62 | HR 96 | Temp 98.4°F | Wt 150.4 lb

## 2019-08-25 DIAGNOSIS — E785 Hyperlipidemia, unspecified: Secondary | ICD-10-CM | POA: Diagnosis not present

## 2019-08-25 DIAGNOSIS — I4891 Unspecified atrial fibrillation: Secondary | ICD-10-CM

## 2019-08-25 DIAGNOSIS — G4700 Insomnia, unspecified: Secondary | ICD-10-CM | POA: Diagnosis not present

## 2019-08-25 DIAGNOSIS — I10 Essential (primary) hypertension: Secondary | ICD-10-CM | POA: Diagnosis not present

## 2019-08-25 DIAGNOSIS — M109 Gout, unspecified: Secondary | ICD-10-CM

## 2019-08-25 DIAGNOSIS — D171 Benign lipomatous neoplasm of skin and subcutaneous tissue of trunk: Secondary | ICD-10-CM

## 2019-08-25 MED ORDER — ZOLPIDEM TARTRATE 10 MG PO TABS: 10.0000 mg | ORAL_TABLET | Freq: Every evening | ORAL | 0 refills | Status: DC | PRN

## 2019-08-25 MED ORDER — COLCHICINE 0.6 MG PO TABS: ORAL_TABLET | ORAL | 5 refills | Status: DC

## 2019-08-25 NOTE — Patient Instructions (Signed)
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
## 2019-11-16 ENCOUNTER — Other Ambulatory Visit: Payer: Self-pay | Admitting: Family Medicine

## 2020-04-23 ENCOUNTER — Encounter: Payer: Self-pay | Admitting: Cardiology

## 2020-05-12 ENCOUNTER — Other Ambulatory Visit: Payer: Self-pay | Admitting: Cardiology

## 2020-06-27 ENCOUNTER — Other Ambulatory Visit: Payer: Self-pay

## 2020-06-28 ENCOUNTER — Encounter: Payer: Self-pay | Admitting: Family Medicine

## 2020-06-28 ENCOUNTER — Ambulatory Visit: Payer: BC Managed Care – PPO | Admitting: Family Medicine

## 2020-06-28 VITALS — BP 122/62 | HR 102 | Temp 97.7°F | Wt 150.0 lb

## 2020-06-28 DIAGNOSIS — I10 Essential (primary) hypertension: Secondary | ICD-10-CM | POA: Diagnosis not present

## 2020-06-28 DIAGNOSIS — N5089 Other specified disorders of the male genital organs: Secondary | ICD-10-CM | POA: Diagnosis not present

## 2020-06-28 DIAGNOSIS — I4891 Unspecified atrial fibrillation: Secondary | ICD-10-CM

## 2020-06-28 MED ORDER — CLOTRIMAZOLE-BETAMETHASONE 1-0.05 % EX CREA
1.0000 | TOPICAL_CREAM | Freq: Every day | CUTANEOUS | 2 refills | Status: DC
Start: 2020-06-28 — End: 2020-12-05
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
## 2020-06-28 NOTE — Patient Instructions (Addendum)
Consider either getting new BP cuff or bring in yours to our office so that we can check accuracy with ours.    SET UP PHYSICAL EXAM SOON- FOR A FRIDAY

## 2020-07-22 ENCOUNTER — Other Ambulatory Visit: Payer: Self-pay

## 2020-07-22 ENCOUNTER — Encounter: Payer: Self-pay | Admitting: Family Medicine

## 2020-07-22 ENCOUNTER — Ambulatory Visit (INDEPENDENT_AMBULATORY_CARE_PROVIDER_SITE_OTHER): Payer: BC Managed Care – PPO | Admitting: Family Medicine

## 2020-07-22 VITALS — BP 140/70 | HR 71 | Temp 97.7°F | Ht 66.0 in | Wt 145.1 lb

## 2020-07-22 DIAGNOSIS — Z0000 Encounter for general adult medical examination without abnormal findings: Secondary | ICD-10-CM | POA: Diagnosis not present

## 2020-07-22 LAB — CBC WITH DIFFERENTIAL/PLATELET
Basophils Absolute: 0 10*3/uL (ref 0.0–0.1)
Basophils Relative: 0.2 % (ref 0.0–3.0)
Eosinophils Absolute: 0 10*3/uL (ref 0.0–0.7)
Eosinophils Relative: 0.4 % (ref 0.0–5.0)
HCT: 49.1 % (ref 39.0–52.0)
Hemoglobin: 17 g/dL (ref 13.0–17.0)
Lymphocytes Relative: 22.5 % (ref 12.0–46.0)
Lymphs Abs: 1.4 10*3/uL (ref 0.7–4.0)
MCHC: 34.7 g/dL (ref 30.0–36.0)
MCV: 98.4 fl (ref 78.0–100.0)
Monocytes Absolute: 0.3 10*3/uL (ref 0.1–1.0)
Monocytes Relative: 5.6 % (ref 3.0–12.0)
Neutro Abs: 4.4 10*3/uL (ref 1.4–7.7)
Neutrophils Relative %: 71.3 % (ref 43.0–77.0)
Platelets: 190 10*3/uL (ref 150.0–400.0)
RBC: 4.99 Mil/uL (ref 4.22–5.81)
RDW: 13.1 % (ref 11.5–15.5)
WBC: 6.2 10*3/uL (ref 4.0–10.5)

## 2020-07-22 LAB — LIPID PANEL
Cholesterol: 219 mg/dL — ABNORMAL HIGH (ref 0–200)
HDL: 70.7 mg/dL (ref >39.00)
LDL Cholesterol: 124 mg/dL — ABNORMAL HIGH (ref 0–99)
NonHDL: 147.93
Total CHOL/HDL Ratio: 3
Triglycerides: 122 mg/dL (ref 0.0–149.0)
VLDL: 24.4 mg/dL (ref 0.0–40.0)

## 2020-07-22 LAB — HEPATIC FUNCTION PANEL
ALT: 16 U/L (ref 0–53)
AST: 19 U/L (ref 0–37)
Albumin: 4.7 g/dL (ref 3.5–5.2)
Alkaline Phosphatase: 57 U/L (ref 39–117)
Bilirubin, Direct: 0.3 mg/dL (ref 0.0–0.3)
Total Bilirubin: 1.7 mg/dL — ABNORMAL HIGH (ref 0.2–1.2)
Total Protein: 7.3 g/dL (ref 6.0–8.3)

## 2020-07-22 LAB — BASIC METABOLIC PANEL
BUN: 16 mg/dL (ref 6–23)
CO2: 31 mEq/L (ref 19–32)
Calcium: 9.9 mg/dL (ref 8.4–10.5)
Chloride: 99 mEq/L (ref 96–112)
Creatinine, Ser: 1.09 mg/dL (ref 0.40–1.50)
GFR: 69.47 mL/min (ref >60.00)
Glucose, Bld: 91 mg/dL (ref 70–99)
Potassium: 4.2 mEq/L (ref 3.5–5.1)
Sodium: 137 mEq/L (ref 135–145)

## 2020-07-22 LAB — TSH: TSH: 1.13 u[IU]/mL (ref 0.35–5.50)

## 2020-07-22 LAB — PSA: PSA: 0.42 ng/mL (ref 0.10–4.00)

## 2020-07-22 MED ORDER — AMLODIPINE BESYLATE 2.5 MG PO TABS
2.5000 mg | ORAL_TABLET | Freq: Every day | ORAL | 1 refills | Status: DC
Start: 2020-07-22 — End: 2020-07-25

## 2020-07-22 NOTE — Patient Instructions (Signed)
Set up one month follow up to reassess you blood pressure.
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
## 2020-07-25 ENCOUNTER — Telehealth: Payer: Self-pay | Admitting: Family Medicine

## 2020-07-25 MED ORDER — HYDROCHLOROTHIAZIDE 12.5 MG PO CAPS: 12.5000 mg | ORAL_CAPSULE | Freq: Every day | ORAL | 2 refills | Status: DC

## 2020-07-25 NOTE — Addendum Note (Signed)
Addended by: Rebecca Eaton on: 07/25/2020 04:51 PM   Modules accepted: Orders

## 2020-07-25 NOTE — Telephone Encounter (Signed)
Per Dr. Elease Hashimoto, "Gingival swelling can be seen with amlodipine.  Take amlodipine off med list and add amlodipine to allergies/intolerance (and put gingival swelling as side effect).  Start HCTZ 12.5 mg once daily dispense #30 with 2 refills and make sure patient has office follow-up in 1 month or so to reassess blood pressure."  Rx has been sent in, allergy list has been updated and the patient has been notified of these changes. Pt will call back to schedule.

## 2020-07-25 NOTE — Telephone Encounter (Signed)
Please advise 

## 2020-07-25 NOTE — Telephone Encounter (Signed)
Pt is calling in stating that he is having allergic reaction to the Rx amlodipine (NORVASC) 2.5 MG the medication is making his legs swell and his gums (pt stated that he has had it in the past and it caused him to swell and did not pick it up due to the allergic reaction in the past).  Pt is wanting to see if he could get a different kind of medication.  Pharm:  [facility] in [place]

## 2020-08-17 ENCOUNTER — Other Ambulatory Visit: Payer: Self-pay | Admitting: Family Medicine

## 2020-08-17 NOTE — Telephone Encounter (Signed)
Please advise. This is not on the current med list.

## 2020-08-18 NOTE — Telephone Encounter (Signed)
Refused refill.    He cannot take amlodipine.   Hx of adverse side effect.

## 2020-08-19 ENCOUNTER — Other Ambulatory Visit: Payer: Self-pay | Admitting: Family Medicine

## 2020-09-23 ENCOUNTER — Other Ambulatory Visit: Payer: Self-pay

## 2020-09-23 ENCOUNTER — Ambulatory Visit: Payer: BC Managed Care – PPO | Admitting: Family Medicine

## 2020-09-23 ENCOUNTER — Encounter: Payer: Self-pay | Admitting: Family Medicine

## 2020-09-23 VITALS — BP 140/80 | HR 69 | Temp 97.9°F | Wt 146.0 lb

## 2020-09-23 DIAGNOSIS — I10 Essential (primary) hypertension: Secondary | ICD-10-CM | POA: Diagnosis not present

## 2020-09-23 MED ORDER — OLMESARTAN MEDOXOMIL 20 MG PO TABS: 20.0000 mg | ORAL_TABLET | Freq: Every day | ORAL | 11 refills | Status: DC
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
## 2020-10-10 ENCOUNTER — Telehealth: Payer: Self-pay | Admitting: Family Medicine

## 2020-10-10 ENCOUNTER — Other Ambulatory Visit: Payer: Self-pay | Admitting: Family Medicine

## 2020-10-10 NOTE — Telephone Encounter (Signed)
PT called wanting to speak to Dr.Burchette nurse in regards to their BP medication and BP being high they stated it ranges from 120/70ish to 135/80ish. Please advise.

## 2020-10-10 NOTE — Telephone Encounter (Signed)
Left message for patient to call back  

## 2020-10-10 NOTE — Telephone Encounter (Signed)
Spoke with the patient. He stated that his medication was changed to olmesartan. Since then his BP has been 125/81. He stated his blood pressure is usually 112/65.    Please review in Dr. Erick Blinks absence.   Patient asked that we please send a response through mychart since he is unable to answer the phone while at work.

## 2020-10-10 NOTE — Telephone Encounter (Signed)
Patient called to return [place] phone call.  Patient could be contacted at [phone].  Please advise.

## 2020-10-11 NOTE — Telephone Encounter (Signed)
Spoke with the patient, he is aware of Cory's message. He asked to schedule a follow up for his BP when Dr. Elease Hashimoto returns. Appointment has been scheduled.

## 2020-10-19 ENCOUNTER — Ambulatory Visit: Payer: BC Managed Care – PPO | Admitting: Family Medicine

## 2020-11-12 ENCOUNTER — Other Ambulatory Visit: Payer: Self-pay | Admitting: Family Medicine

## 2020-11-13 ENCOUNTER — Other Ambulatory Visit: Payer: Self-pay | Admitting: Cardiology

## 2020-11-14 NOTE — Telephone Encounter (Signed)
Prescription refill request for Xarelto received.  Indication:afib Last office visit:hochrein 05/29/19 Weight:66.2kg Age:20m Scr:1.09 07/22/20 CrCl:59.9

## 2020-11-20 ENCOUNTER — Other Ambulatory Visit: Payer: Self-pay | Admitting: Cardiology

## 2020-11-21 NOTE — Telephone Encounter (Signed)
Prescription refill request for Xarelto received.  Indication:Afib Last office visit:needs appointment Weight:66.2 kg Age:70 Scr:1.0 CrCl:103.01 ml/min  Prescription refilled

## 2020-11-23 ENCOUNTER — Other Ambulatory Visit: Payer: Self-pay | Admitting: Cardiology

## 2020-11-23 NOTE — Telephone Encounter (Signed)
Saw where refill was sent in 10/31 and then a refill was also sent in on 11/6 ( but  possibly did not go through).   Called and spoke to [facility] pharmacy staff who stated that pt picked up Xarelto refill on 11/6 and that she had another prescription on file from Dr Percival Spanish for [place]. Informed them that pt will need to see cardiologist for future refills. [facility] stated that they will delete the prescription on file so pt will have to reach out to office for more refills of Xarelto.

## 2020-11-23 NOTE — Telephone Encounter (Addendum)
Prescription refill request for Xarelto received.  Indication: afib  Last office visit: Hochrein, 05/29/2019 Weight: 66.2 kg  Age: 70 yo  Scr: 1.09, 07/22/2020 CrCl: 59 ml/min    Pt due to see cardiologist. Message sent to schedulers to reach out  to pt to schedule an appointment.  Refill sent for a 1 month supply so that pt does not run out.

## 2020-11-28 NOTE — Telephone Encounter (Signed)
Message Received: 3 days ago [place], Andee Poles, RN Patient states that he does not need an appointment with the doctor, I stressed that he is due and in order to get further refills on Xarelto he will need to schedule but patient continued to decline.        Previous Messages   ----- Message -----  From: Lutricia Feil, RN  Sent: 11/23/2020  12:29 PM EST  To: [facility]   Received refill request for Xarelto. Pt is overdue to see cardiologist. Can you please schedule pt an appointment to see cardiologist.   Thank you!  [facility]

## 2020-12-05 ENCOUNTER — Other Ambulatory Visit: Payer: Self-pay | Admitting: Family Medicine

## 2020-12-05 NOTE — Telephone Encounter (Signed)
Please advise. HCTZ is not on the current med list.

## 2020-12-07 ENCOUNTER — Ambulatory Visit: Payer: BC Managed Care – PPO | Admitting: Family Medicine

## 2020-12-07 VITALS — BP 120/72 | HR 87 | Temp 97.7°F | Wt 147.6 lb

## 2020-12-07 DIAGNOSIS — M109 Gout, unspecified: Secondary | ICD-10-CM

## 2020-12-07 DIAGNOSIS — I10 Essential (primary) hypertension: Secondary | ICD-10-CM

## 2020-12-07 DIAGNOSIS — G4700 Insomnia, unspecified: Secondary | ICD-10-CM

## 2020-12-07 DIAGNOSIS — I4891 Unspecified atrial fibrillation: Secondary | ICD-10-CM

## 2020-12-07 MED ORDER — COLCHICINE 0.6 MG PO TABS: ORAL_TABLET | ORAL | 5 refills | Status: DC

## 2020-12-07 MED ORDER — ZOLPIDEM TARTRATE 10 MG PO TABS: 10.0000 mg | ORAL_TABLET | Freq: Every evening | ORAL | 0 refills | Status: DC | PRN

## 2020-12-07 MED ORDER — OLMESARTAN MEDOXOMIL 20 MG PO TABS: 20.0000 mg | ORAL_TABLET | Freq: Every day | ORAL | 3 refills | Status: DC
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
## 2020-12-09 ENCOUNTER — Other Ambulatory Visit: Payer: Self-pay | Admitting: Cardiology

## 2020-12-09 ENCOUNTER — Other Ambulatory Visit: Payer: Self-pay | Admitting: Family Medicine

## 2020-12-12 MED ORDER — RIVAROXABAN 20 MG PO TABS: 20.0000 mg | ORAL_TABLET | Freq: Every day | ORAL | 0 refills | Status: DC

## 2020-12-12 NOTE — Telephone Encounter (Signed)
Prescription refill request for Xarelto received.  Indication:Afib Last office visit:Needs Visit Weight:67 kg Age:70 Scr:1.0 CrCl:66.07 ml/min  Prescription refilled

## 2020-12-12 NOTE — Telephone Encounter (Signed)
Last filled 12/07/2020 Last OV 12/07/2020  Ok to fill?

## 2021-01-08 ENCOUNTER — Other Ambulatory Visit: Payer: Self-pay | Admitting: Cardiology

## 2021-01-10 NOTE — Telephone Encounter (Signed)
Prescription refill request for Xarelto received.  Indication: AF Last office visit: 5/21 Weight: 67 kg Age: 70 Scr: 1.09 (7/22) CrCl: 61

## 2021-02-04 ENCOUNTER — Other Ambulatory Visit: Payer: Self-pay | Admitting: Cardiology

## 2021-02-07 MED ORDER — RIVAROXABAN 20 MG PO TABS: ORAL_TABLET | ORAL | 0 refills | Status: DC

## 2021-02-07 NOTE — Telephone Encounter (Signed)
Prescription refill request for Xarelto received.  Indication:afib  Last office visit: 05/19/2019 Weight: 67 kg Age: 71 Scr: 1.09, 07/22/2020 CrCl: 61 ml/min   Pt is overdue for an office visit. Called pt and LMOM

## 2021-02-07 NOTE — Telephone Encounter (Signed)
Called and spoke to pt. Made him aware that he as to see the Dr. To continue to get his Xarelto refilled. Pt stated that he is completely out of Xarelto. Pt stated that he can not make an appt right now. Gave pt number to make appt. Informed pt if he is not going to make appt with cardiologist he will need to call PCP to see if he can refill Xarelto. Pt stated that he would make appt with cardiologist but will need to call at a later time. Informed pt that I would only send in a 2 week supply. Then if he has an appointment scheduled we could give more. Pt verbalized understanding.

## 2021-02-10 ENCOUNTER — Other Ambulatory Visit: Payer: Self-pay | Admitting: Cardiology

## 2021-02-14 ENCOUNTER — Other Ambulatory Visit: Payer: Self-pay | Admitting: Family Medicine

## 2021-02-14 ENCOUNTER — Ambulatory Visit: Payer: BC Managed Care – PPO | Admitting: Family Medicine

## 2021-02-14 VITALS — BP 120/80 | HR 76 | Temp 97.7°F | Wt 150.7 lb

## 2021-02-14 DIAGNOSIS — I4891 Unspecified atrial fibrillation: Secondary | ICD-10-CM | POA: Diagnosis not present

## 2021-02-14 MED ORDER — RIVAROXABAN 20 MG PO TABS: ORAL_TABLET | ORAL | 1 refills | Status: DC

## 2021-02-14 NOTE — Patient Instructions (Addendum)
Set up physical for sometime around July of this year.    We will plan to get labs then.

## 2021-02-14 NOTE — Telephone Encounter (Signed)
Last filled 12/07/2020 Last OV today.  Ok to fill?
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
## 2021-05-16 ENCOUNTER — Other Ambulatory Visit: Payer: Self-pay | Admitting: Family Medicine

## 2021-07-17 ENCOUNTER — Other Ambulatory Visit: Payer: Self-pay | Admitting: Family Medicine

## 2021-08-09 ENCOUNTER — Telehealth (INDEPENDENT_AMBULATORY_CARE_PROVIDER_SITE_OTHER): Payer: BC Managed Care – PPO | Admitting: Family Medicine

## 2021-08-09 ENCOUNTER — Telehealth: Payer: Self-pay | Admitting: Family Medicine

## 2021-08-09 ENCOUNTER — Encounter: Payer: Self-pay | Admitting: Family Medicine

## 2021-08-09 VITALS — Ht 66.0 in | Wt 150.7 lb

## 2021-08-09 DIAGNOSIS — S39012A Strain of muscle, fascia and tendon of lower back, initial encounter: Secondary | ICD-10-CM

## 2021-08-09 NOTE — Telephone Encounter (Signed)
Pt added to schedule for today.

## 2021-08-09 NOTE — Telephone Encounter (Addendum)
Pt called to ask if he could have a doctors note so that he could return to work.   Pt's last OV:  02/14/21  Pt was asked what his symptoms are and was offered an office visit and he stated he is not sick and said he would call back.
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
## 2021-09-27 ENCOUNTER — Ambulatory Visit: Payer: BC Managed Care – PPO | Admitting: Family Medicine

## 2021-09-27 ENCOUNTER — Encounter: Payer: Self-pay | Admitting: Family Medicine

## 2021-09-27 VITALS — BP 110/80 | HR 70 | Temp 97.7°F | Ht 66.0 in | Wt 147.2 lb

## 2021-09-27 DIAGNOSIS — I10 Essential (primary) hypertension: Secondary | ICD-10-CM | POA: Diagnosis not present

## 2021-09-27 DIAGNOSIS — I4891 Unspecified atrial fibrillation: Secondary | ICD-10-CM

## 2021-09-27 DIAGNOSIS — M109 Gout, unspecified: Secondary | ICD-10-CM

## 2021-09-27 DIAGNOSIS — Z23 Encounter for immunization: Secondary | ICD-10-CM | POA: Diagnosis not present

## 2021-09-27 MED ORDER — COLCHICINE 0.6 MG PO TABS: ORAL_TABLET | ORAL | 5 refills | Status: AC

## 2021-09-27 MED ORDER — APIXABAN 5 MG PO TABS: 5.0000 mg | ORAL_TABLET | Freq: Two times a day (BID) | ORAL | 3 refills | Status: DC
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
## 2021-09-27 NOTE — Addendum Note (Signed)
Addended by: Nilda Riggs on: 09/27/2021 05:05 PM   Modules accepted: Orders

## 2021-11-03 ENCOUNTER — Ambulatory Visit: Payer: BC Managed Care – PPO | Admitting: Family Medicine

## 2021-11-06 IMAGING — DX DG CHEST 2V
2 series · 2 of 2 positions shown · non-contrast
Comparison: 11/23/2013

CLINICAL DATA: Left-sided chest pain

EXAM:
CHEST - 2 VIEW

[chest pa]
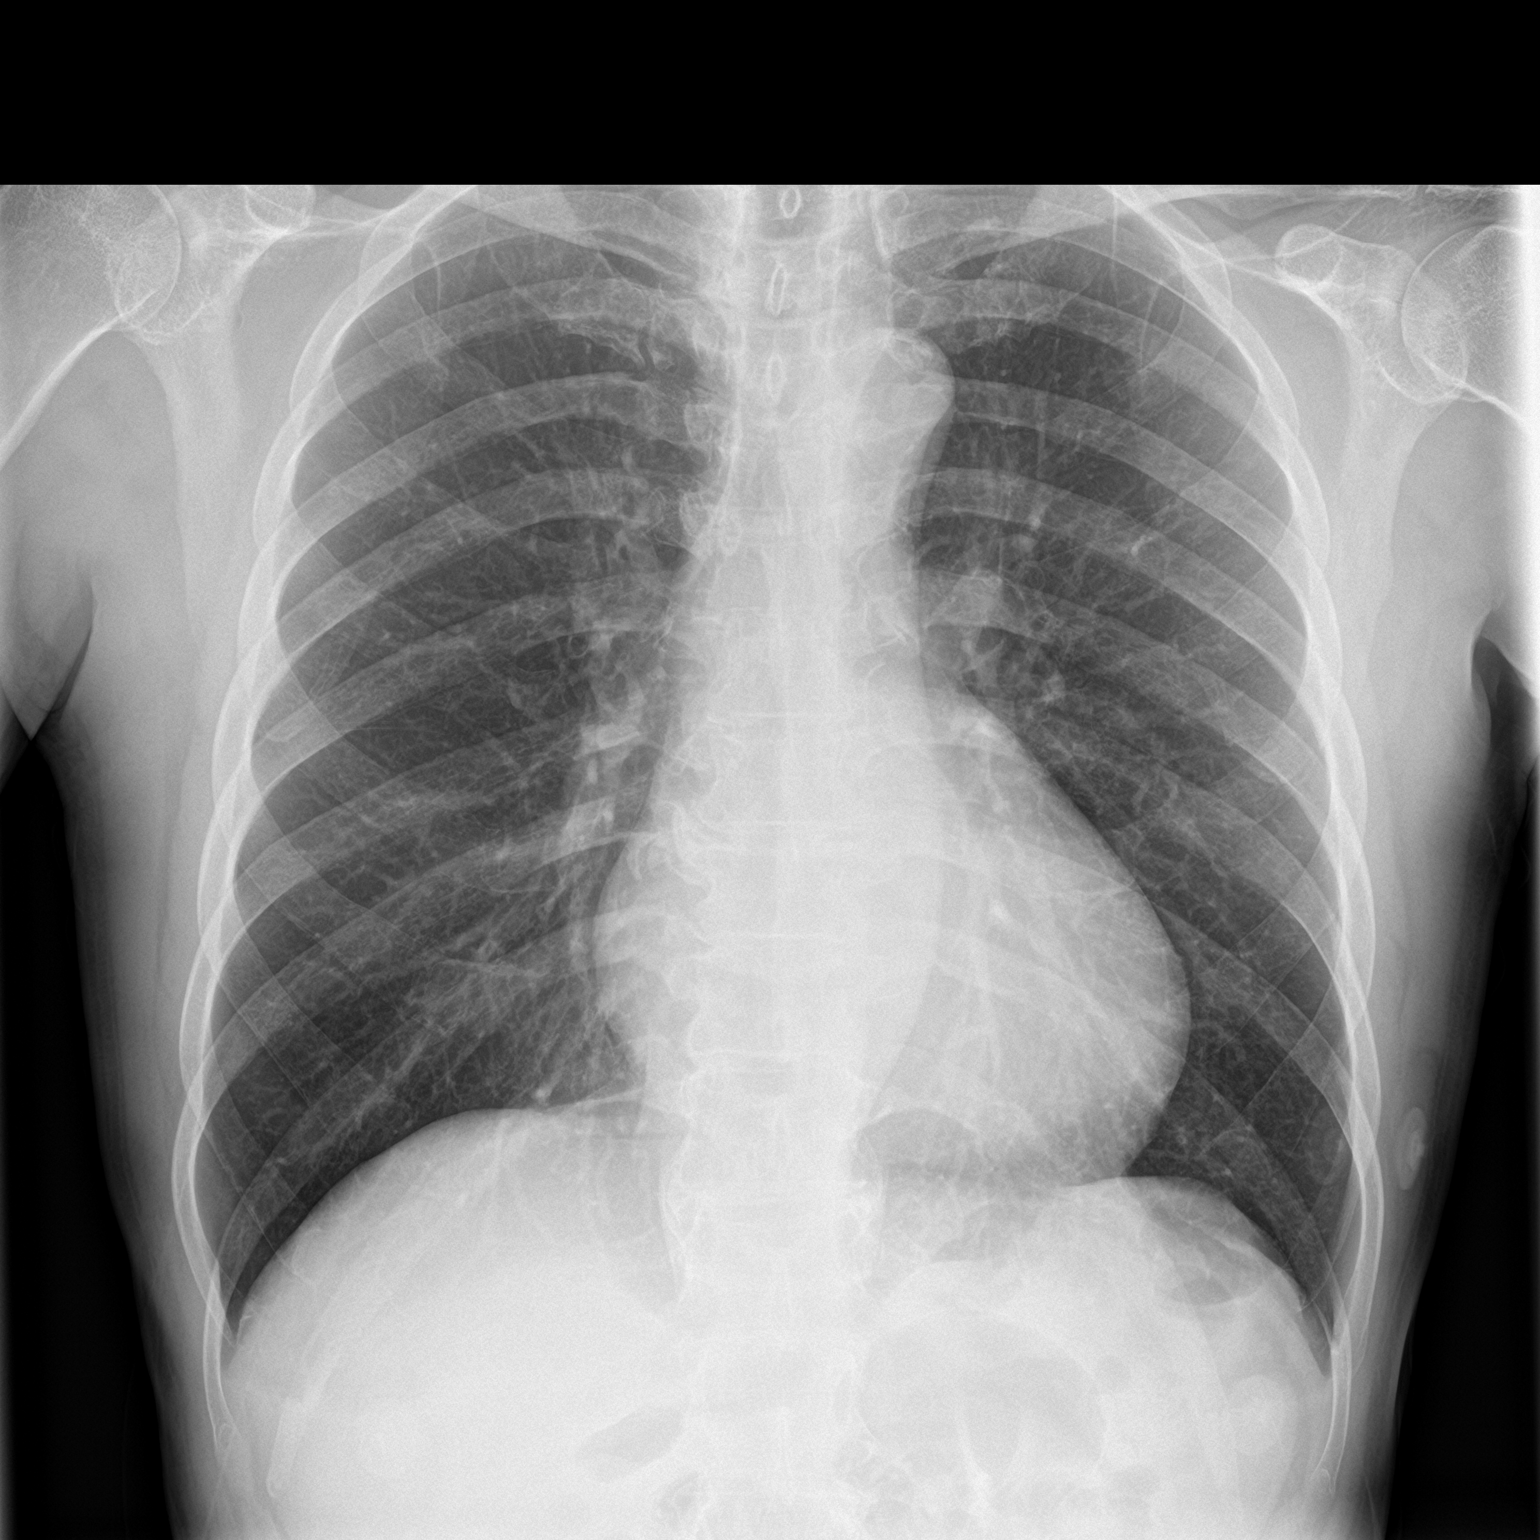

[chest lat]
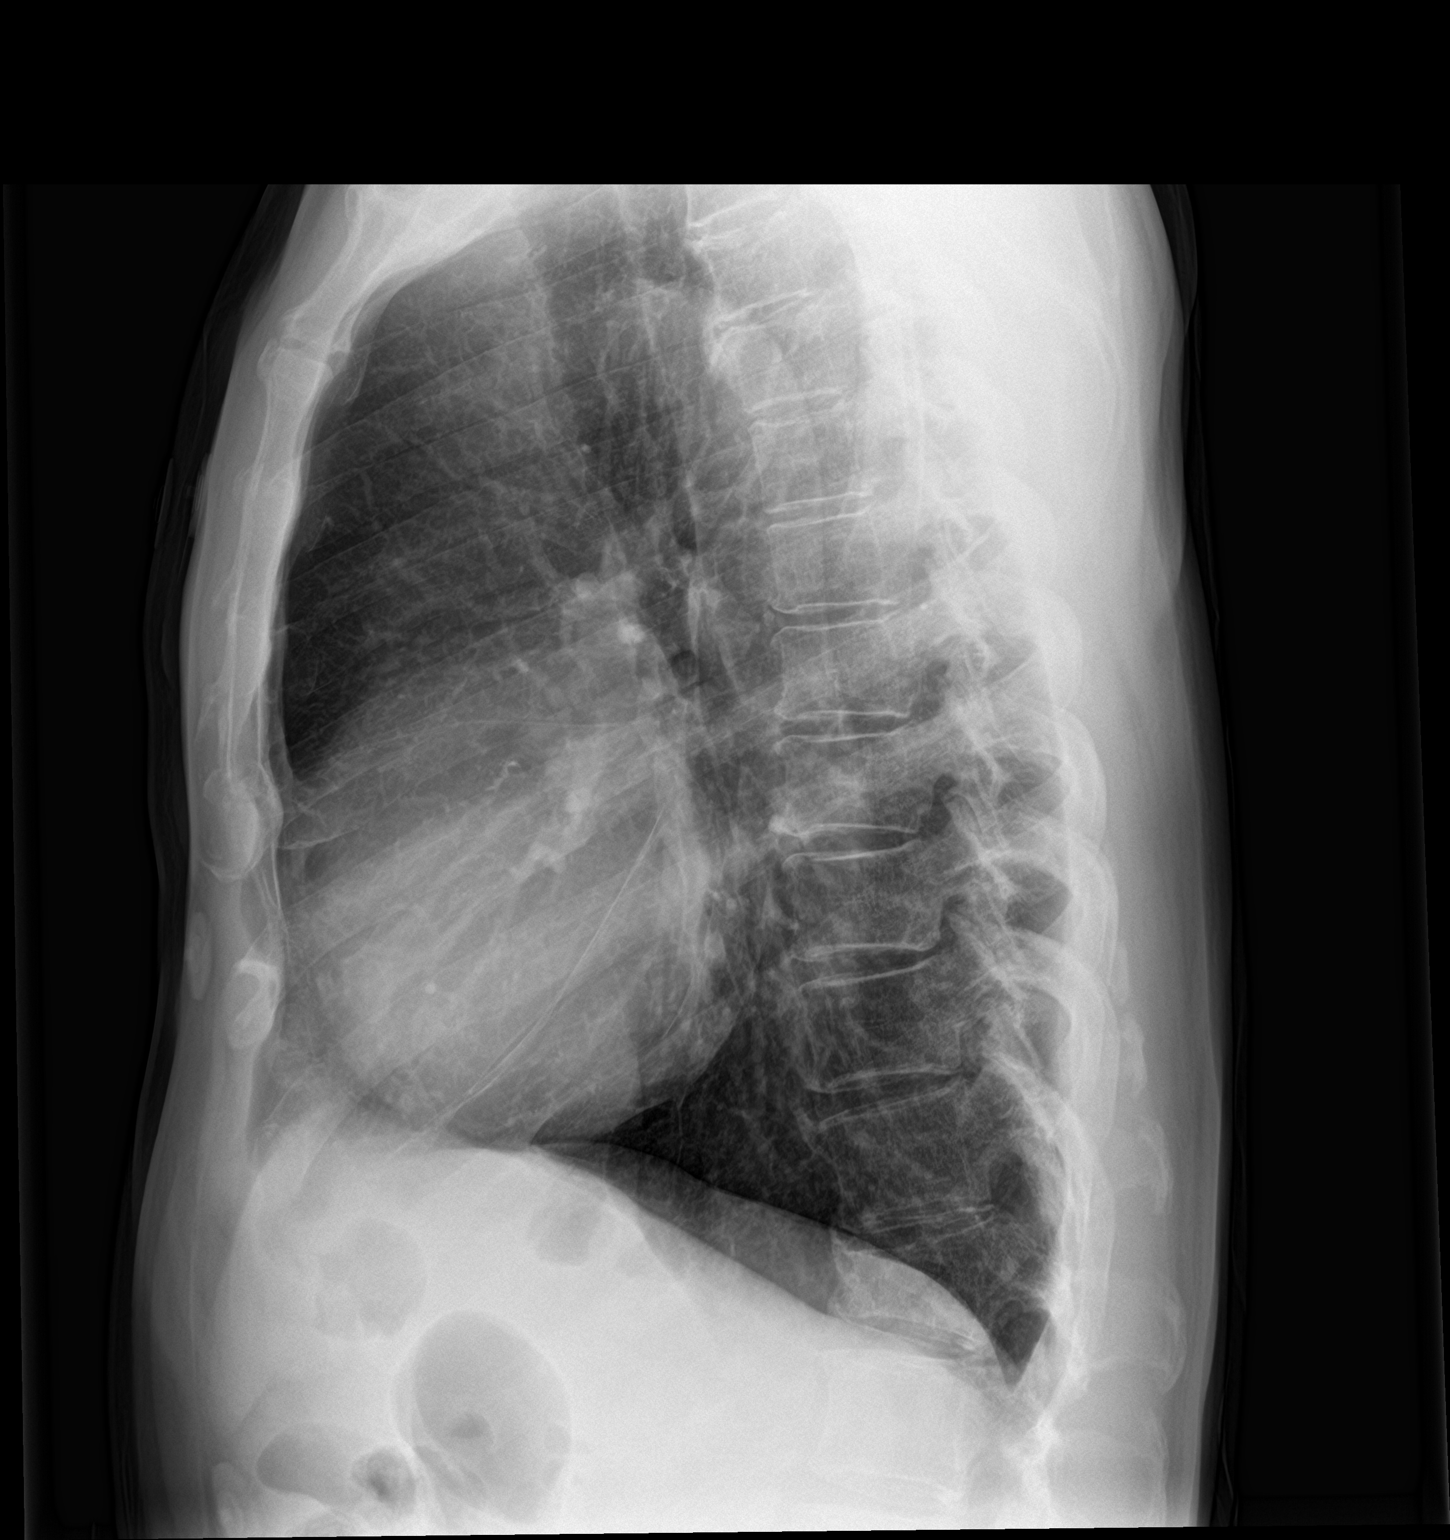

[2 of 2 positions shown; findings below may reference images not displayed]

FINDINGS: The heart size and mediastinal contours are within normal limits.
Atherosclerotic calcification of the aortic knob. No focal airspace
consolidation, pleural effusion, or pneumothorax. Mild chronic
compression deformity at the thoracolumbar junction, unchanged from
prior.
IMPRESSION: No active cardiopulmonary disease.

## 2022-02-22 ENCOUNTER — Other Ambulatory Visit: Payer: Self-pay | Admitting: Family Medicine

## 2022-02-26 ENCOUNTER — Other Ambulatory Visit: Payer: Self-pay | Admitting: Family Medicine

## 2022-02-26 ENCOUNTER — Ambulatory Visit: Payer: Self-pay

## 2022-02-26 DIAGNOSIS — M25531 Pain in right wrist: Secondary | ICD-10-CM

## 2022-04-20 ENCOUNTER — Encounter: Payer: Self-pay | Admitting: Gastroenterology

## 2022-05-20 ENCOUNTER — Other Ambulatory Visit: Payer: Self-pay | Admitting: Family Medicine

## 2022-05-21 ENCOUNTER — Other Ambulatory Visit: Payer: Self-pay | Admitting: Family Medicine

## 2022-05-21 ENCOUNTER — Other Ambulatory Visit: Payer: BC Managed Care – PPO

## 2022-05-21 DIAGNOSIS — M545 Low back pain, unspecified: Secondary | ICD-10-CM

## 2022-06-21 ENCOUNTER — Other Ambulatory Visit: Payer: Self-pay | Admitting: Family Medicine

## 2022-08-07 ENCOUNTER — Ambulatory Visit: Payer: BC Managed Care – PPO | Admitting: Physician Assistant

## 2022-08-07 ENCOUNTER — Encounter: Payer: Self-pay | Admitting: Physician Assistant

## 2022-08-07 VITALS — BP 110/80 | HR 76 | Ht 66.5 in | Wt 145.0 lb

## 2022-08-07 DIAGNOSIS — D126 Benign neoplasm of colon, unspecified: Secondary | ICD-10-CM

## 2022-08-07 DIAGNOSIS — Z86010 Personal history of colonic polyps: Secondary | ICD-10-CM | POA: Diagnosis not present

## 2022-08-07 NOTE — Progress Notes (Signed)
Subjective:    Patient ID: Mark [name], male    DOB: [birth year], 72 y.o.   MRN: [id number]  HPI  Mark [name] is a pleasant 72 year old Asian male, who comes in today to discuss recall colonoscopy.  He had been established with Dr. Christella [name] and had undergone colonoscopy in February 2021 with finding of 5 sessile and semipedunculated polyps from the rectosigmoid and left colon.  These were 2 to 11 mm in size and path showed them all to be tubular adenomas, and therefore indicated for 3-year interval follow-up.  He also has history of hypertension and atrial fibrillation for which she has been on Eliquis as well as gout. He states that his PCP Dr. Caryl [name] prescribes his Eliquis.  He does not have any other cardiac issues that he is aware of.  He had very remote echo in 2010 that showed mild LVH and moderate systolic dysfunction.  He has no current GI complaints, specifically denies any complaints of abdominal pain or discomfort, no changes in bowel habits constipation or diarrhea and no melena or hematochezia.  Review of Systems Pertinent positive and negative review of systems were noted in the above HPI section.  All other review of systems was otherwise negative.   Outpatient Encounter Medications as of 08/07/2022  Medication Sig   apixaban (ELIQUIS) 5 MG TABS tablet Take 1 tablet (5 mg total) by mouth 2 (two) times daily.   clotrimazole-betamethasone (LOTRISONE) cream APPLY 1 APPLICATION TOPICALLY DAILY   colchicine 0.6 MG tablet TAKE TWO TABLETS AT ONSET OF GOUT AND THEN ONE EVERY 12 HOURS AS NEEDED   fluticasone (FLONASE) 50 MCG/ACT nasal spray SPRAY 2 SPRAYS INTO EACH NOSTRIL EVERY DAY (Patient taking differently: Place 2 sprays into both nostrils daily as needed for allergies or rhinitis.)   olmesartan (BENICAR) 20 MG tablet TAKE 1 TABLET BY MOUTH EVERY DAY   zolpidem (AMBIEN) 10 MG tablet TAKE 1 TABLET BY MOUTH EVERY DAY AT BEDTIME AS NEEDED   No facility-administered encounter medications on  file as of 08/07/2022.   Allergies  Allergen Reactions   Amlodipine    Patient Active Problem List   Diagnosis Date Noted   Dyslipidemia 05/29/2019   AF (atrial fibrillation) (HCC)    Chest pain    Colon adenomas 02/07/2018   INSOMNIA, TRANSIENT 10/11/2009   GOUT, UNSPECIFIED 10/06/2009   ACUTE GINGIVITIS NONPLAQUE INDUCED 02/21/2009   TRIGGER FINGER 02/21/2009   VERTIGO, CHRONIC 09/09/2008   Essential hypertension 07/26/2008   Social History   Socioeconomic History   Marital status: Married    Spouse name: Not on file   Number of children: 2   Years of education: Not on file   Highest education level: Bachelor's degree (e.g., BA, AB, BS)  Occupational History   Occupation: [employer]  Tobacco Use   Smoking status: [name]   Smokeless tobacco: [name]  Vaping Use   Vaping status: [name] Used  Substance and Sexual Activity   Alcohol use: Yes    Alcohol/week: 2.0 standard drinks of alcohol    Types: 2 drink(s) per week    Comment: 1-2 per week   Drug use: No   Sexual activity: Not on file  Other Topics Concern   Not on file  Social History Narrative   Lives at home with wife.  Two children.    Custodian.     Social Determinants of Health   Financial Resource Strain: Not on file  Food Insecurity: No Food Insecurity (09/23/2021)   Hunger Vital  Sign    Worried About Programme researcher, broadcasting/film/video in the Last Year: [name] true    Ran Out of Food in the Last Year: [name] true  Transportation Needs: No Transportation Needs (09/23/2021)   PRAPARE - Administrator, Civil Service (Medical): No    Lack of Transportation (Non-Medical): No  Physical Activity: Sufficiently Active (09/23/2021)   Exercise Vital Sign    Days of Exercise per Week: 5 days    Minutes of Exercise per Session: 90 min  Stress: No Stress Concern Present (09/23/2021)   [employer] of Occupational Health - Occupational Stress Questionnaire    Feeling of Stress : Not at all  Social Connections:  Unknown (09/23/2021)   Social Connection and Isolation Panel [NHANES]    Frequency of Communication with Friends and Family: More than three times a week    Frequency of Social Gatherings with Friends and Family: Twice a week    Attends Religious Services: More than 4 times per year    Active Member of [employer] or Organizations: Not on file    Attends Banker Meetings: Not on file    Marital Status: Married  Catering manager Violence: Not on file    Mark [name] family history includes Breast cancer in his sister; Cancer in his father; Hypertension in his mother; Kidney cancer in his brother.      Objective:    Vitals:   08/07/22 1427  BP: 110/80  Pulse: 76    Physical Exam Well-developed well-nourished f older Asian male  in no acute distress.  Height, Weight,145  BMI 23.05  HEENT; nontraumatic normocephalic, EOMI, PE R LA, sclera anicteric. Oropharynx;not examined today  Neck; supple, no JVD Cardiovascular; regular rate and rhythm with S1-S2, no murmur rub or gallop Pulmonary; Clear bilaterally Abdomen; soft, nontender, nondistended, no palpable mass or hepatosplenomegaly, bowel sounds are active Rectal;not done today  Skin; benign exam, no jaundice rash or appreciable lesions Extremities; no clubbing cyanosis or edema skin warm and dry Neuro/Psych; alert and oriented x4, grossly nonfocal mood and affect appropriate        Assessment & Plan:   #68 72 year old Asian male with history of multiple tubular adenomatous colon polyps, who comes in to discuss recall colonoscopy. His last exam was in February 2021 with Dr. Christella [name] and had 5 tubular adenomatous polyps removed at that time 2 to 11 mm in size.  #2 chronic anticoagulation-on Eliquis #3.  Atrial fibrillation #4.  Hypertension #5.  History of gout.  Plan; patient will be scheduled for colonoscopy with Dr. Barron [name].  Procedure was discussed in detail with the patient including indications risk and benefits  and he is agreeable to proceed.  He was sorry to hear that Dr. Christella [name] is not working at this time. Eliquis will need to be held for 48 hours prior to procedure, we will communicate with his PCP Dr. Caryl [name] who is prescribing Eliquis to ensure this is reasonable for this patient. Further recommendations pending findings at colonoscopy.  Arthurine Oleary Oswald Hillock PA-C 08/07/2022   Cc: Kristian Covey, MD

## 2022-08-07 NOTE — Patient Instructions (Addendum)
_______________________________________________________  If your blood pressure at your visit was 140/90 or greater, please contact your primary care physician to follow up on this.  _______________________________________________________  If you are age 71 or older, your body mass index should be between 23-30. Your Body mass index is 23.05 kg/m. If this is out of the aforementioned range listed, please consider follow up with your Primary Care Provider. ________________________________________________________  [facility] providers would like to encourage you to use [facility] to communicate with providers for non-urgent requests or questions.  Due to long hold times on the telephone, sending your provider a message by [facility] may be a faster and more efficient way to get a response.  Please allow 48 business hours for a response.  Please remember that this is for non-urgent requests.  _______________________________________________________  Bonita Quin have been scheduled for a colonoscopy. Please follow written instructions given to you at your visit today.   Please pick up your prep supplies at the pharmacy within the next 1-3 days.  If you use inhalers (even only as needed), please bring them with you on the day of your procedure.  DO NOT TAKE 7 DAYS PRIOR TO TEST- Trulicity (dulaglutide) Ozempic, Wegovy (semaglutide) Mounjaro (tirzepatide) Bydureon Bcise (exanatide extended release)  DO NOT TAKE 1 DAY PRIOR TO YOUR TEST Rybelsus (semaglutide) Adlyxin (lixisenatide) Victoza (liraglutide) Byetta (exanatide) ___________________________________________________________________________  Due to recent changes in healthcare laws, you may see the results of your imaging and laboratory studies on MyChart before your provider has had a chance to review them.  We understand that in some cases there may be results that are confusing or concerning to you. Not all laboratory results come back in the  same time frame and the provider may be waiting for multiple results in order to interpret others.  Please give [place] 48 hours in order for your provider to thoroughly review all the results before contacting the office for clarification of your results.   Thank you for entrusting me with your care and choosing [facility].  Amy Esterwood, PA-C

## 2022-08-14 ENCOUNTER — Other Ambulatory Visit: Payer: Self-pay | Admitting: Family Medicine

## 2022-08-16 ENCOUNTER — Other Ambulatory Visit: Payer: Self-pay | Admitting: Family Medicine

## 2022-09-07 NOTE — Progress Notes (Signed)
Agree with the assessment and plan as outlined by Amy Esterwood, PA-C.  Vito Cirigliano, DO, FACG  

## 2022-09-10 ENCOUNTER — Encounter: Payer: Self-pay | Admitting: Gastroenterology

## 2022-09-21 ENCOUNTER — Encounter: Payer: Self-pay | Admitting: Gastroenterology

## 2022-09-21 ENCOUNTER — Ambulatory Visit (AMBULATORY_SURGERY_CENTER): Payer: BC Managed Care – PPO | Admitting: Gastroenterology

## 2022-09-21 VITALS — BP 124/69 | HR 67 | Temp 98.5°F | Resp 12 | Ht 66.0 in | Wt 145.0 lb

## 2022-09-21 DIAGNOSIS — D123 Benign neoplasm of transverse colon: Secondary | ICD-10-CM | POA: Diagnosis not present

## 2022-09-21 DIAGNOSIS — K5730 Diverticulosis of large intestine without perforation or abscess without bleeding: Secondary | ICD-10-CM

## 2022-09-21 DIAGNOSIS — Z09 Encounter for follow-up examination after completed treatment for conditions other than malignant neoplasm: Secondary | ICD-10-CM

## 2022-09-21 DIAGNOSIS — D124 Benign neoplasm of descending colon: Secondary | ICD-10-CM | POA: Diagnosis not present

## 2022-09-21 DIAGNOSIS — D125 Benign neoplasm of sigmoid colon: Secondary | ICD-10-CM | POA: Diagnosis not present

## 2022-09-21 DIAGNOSIS — Z86010 Personal history of colonic polyps: Secondary | ICD-10-CM

## 2022-09-21 DIAGNOSIS — K640 First degree hemorrhoids: Secondary | ICD-10-CM

## 2022-09-21 DIAGNOSIS — Z860101 Personal history of adenomatous and serrated colon polyps: Secondary | ICD-10-CM

## 2022-09-21 DIAGNOSIS — D122 Benign neoplasm of ascending colon: Secondary | ICD-10-CM

## 2022-09-21 MED ORDER — SODIUM CHLORIDE 0.9 % IV SOLN: 500.0000 mL | INTRAVENOUS | Status: DC
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
## 2022-09-21 NOTE — Progress Notes (Signed)
Pt's states no medical or surgical changes since previsit or office visit. 

## 2022-09-21 NOTE — Patient Instructions (Addendum)
Recommendation: - Patient has a contact number available for                            emergencies. The signs and symptoms of potential                            delayed complications were discussed with the                            patient. Return to normal activities tomorrow.                            Written discharge instructions were provided to the                            patient.                           - Resume previous diet.                           - Continue present medications.                           - Resume Eliquis (apixaban) at prior dose tomorrow.                           - Await pathology results.                           - Repeat colonoscopy for surveillance based on                            pathology results.                           - Return to GI clinic PRN.  Handouts on polyps, diverticulosis and hemorrhoids given.  YOU HAD AN ENDOSCOPIC PROCEDURE TODAY AT THE [facility]:   Refer to the procedure report that was given to you for any specific questions about what was found during the examination.  If the procedure report does not answer your questions, please call your gastroenterologist to clarify.  If you requested that your care partner not be given the details of your procedure findings, then the procedure report has been included in a sealed envelope for you to review at your convenience later.  YOU SHOULD EXPECT: Some feelings of bloating in the abdomen. Passage of more gas than usual.  Walking can help get rid of the air that was put into your GI tract during the procedure and reduce the bloating. If you had a lower endoscopy (such as a colonoscopy or flexible sigmoidoscopy) you may notice spotting of blood in your stool or on the toilet paper. If you underwent a bowel prep for your procedure, you may not have a normal bowel movement for a few days.  Please Note:  You might notice some irritation and congestion in your nose or some  drainage.  This is from the oxygen used during your procedure.  There is no need for concern and it should clear up in a day or so.  SYMPTOMS TO REPORT IMMEDIATELY:  Following lower endoscopy (colonoscopy or flexible sigmoidoscopy):  Excessive amounts of blood in the stool  Significant tenderness or worsening of abdominal pains  Swelling of the abdomen that is new, acute  Fever of 100F or higher  For urgent or emergent issues, a gastroenterologist can be reached at any hour by calling [phone]. Do not use MyChart messaging for urgent concerns.    DIET:  We do recommend a small meal at first, but then you may proceed to your regular diet.  Drink plenty of fluids but you should avoid alcoholic beverages for 24 hours.  ACTIVITY:  You should plan to take it easy for the rest of today and you should NOT DRIVE or use heavy machinery until tomorrow (because of the sedation medicines used during the test).    FOLLOW UP: Our staff will call the number listed on your records the next business day following your procedure.  We will call around 7:15- 8:00 am to check on you and address any questions or concerns that you may have regarding the information given to you following your procedure. If we do not reach you, we will leave a message.     If any biopsies were taken you will be contacted by phone or by letter within the next 1-3 weeks.  Please call us at [phone] if you have not heard about the biopsies in 3 weeks.    SIGNATURES/CONFIDENTIALITY: You and/or your care partner have signed paperwork which will be entered into your electronic medical record.  These signatures attest to the fact that that the information above on your After Visit Summary has been reviewed and is understood.  Full responsibility of the confidentiality of this discharge information lies with you and/or your care-partner.

## 2022-09-21 NOTE — Op Note (Signed)
[facility] Patient Name: Mark [name] Procedure Date: 09/21/2022 1:15 PM MRN: [id number] Endoscopist: Doristine Locks , MD, [id number] Age: 72 Referring MD:  Date of Birth: 1950-01-22 Gender: Male Account #: [id number] Procedure:                Colonoscopy Indications:              Surveillance: Personal history of adenomatous                            polyps on last colonoscopy 3 years ago,                           Last colonoscopy was 02/2019 and notable for 5                            tubular adenomas ranging 2-11 mm. Medicines:                Monitored Anesthesia Care Procedure:                Pre-Anesthesia Assessment:                           - Prior to the procedure, a History and Physical                            was performed, and patient medications and                            allergies were reviewed. The patient's tolerance of                            previous anesthesia was also reviewed. The risks                            and benefits of the procedure and the sedation                            options and risks were discussed with the patient.                            All questions were answered, and informed consent                            was obtained. Prior Anticoagulants: The patient has                            taken Eliquis (apixaban), last dose was 5 days                            prior to procedure. ASA Grade Assessment: III - A                            patient with severe systemic disease. After  reviewing the risks and benefits, the patient was                            deemed in satisfactory condition to undergo the                            procedure.                           After obtaining informed consent, the colonoscope                            was passed under direct vision. Throughout the                            procedure, the patient's blood pressure, pulse, and                             oxygen saturations were monitored continuously. The                            CF [id number] #[id number] was introduced through the anus                            and advanced to the the terminal ileum. The                            colonoscopy was performed without difficulty. The                            patient tolerated the procedure well. The quality                            of the bowel preparation was good. The terminal                            ileum, ileocecal valve, appendiceal orifice, and                            rectum were photographed. Scope In: 1:21:14 PM Scope Out: 1:48:18 PM Scope Withdrawal Time: 0 hours 16 minutes 23 seconds  Total Procedure Duration: 0 hours 27 minutes 4 seconds  Findings:                 The perianal and digital rectal examinations were                            normal.                           A 6 mm polyp was found in the sigmoid colon. The                            polyp was sessile. The polyp was removed with a  cold snare. Resection and retrieval were complete.                            Estimated blood loss was minimal.                           Seven sessile polyps were found in the descending                            colon, transverse colon, and ascending colon. The                            polyps were 3 to 6 mm in size. These polyps were                            removed with a cold snare. Resection and retrieval                            were complete. Estimated blood loss was minimal.                           A single small-mouthed diverticulum was found in                            the ascending colon.                           Non-bleeding internal hemorrhoids were found during                            retroflexion. The hemorrhoids were small.                           The terminal ileum appeared normal. Complications:            No immediate complications. Estimated Blood Loss:     Estimated  blood loss was minimal. Impression:               - One 6 mm polyp in the sigmoid colon, removed with                            a cold snare. Resected and retrieved.                           - Seven 3 to 6 mm polyps in the descending colon,                            in the transverse colon and in the ascending colon,                            removed with a cold snare. Resected and retrieved.                           - Diverticulosis in the ascending colon.                           -  Non-bleeding internal hemorrhoids.                           - The examined portion of the ileum was normal. Recommendation:           - Patient has a contact number available for                            emergencies. The signs and symptoms of potential                            delayed complications were discussed with the                            patient. Return to normal activities tomorrow.                            Written discharge instructions were provided to the                            patient.                           - Resume previous diet.                           - Continue present medications.                           - Resume Eliquis (apixaban) at prior dose tomorrow.                           - Await pathology results.                           - Repeat colonoscopy for surveillance based on                            pathology results.                           - Return to GI clinic PRN. Doristine Locks, MD 09/21/2022 1:57:54 PM

## 2022-09-21 NOTE — Progress Notes (Signed)
Vss nad trans to pacu 

## 2022-09-24 ENCOUNTER — Telehealth: Payer: Self-pay

## 2022-09-24 NOTE — Telephone Encounter (Signed)
Follow up call to pt, no answer. 

## 2022-09-26 LAB — SURGICAL PATHOLOGY

## 2022-10-04 ENCOUNTER — Telehealth: Payer: Self-pay | Admitting: Gastroenterology

## 2022-10-04 NOTE — Telephone Encounter (Signed)
Contacted patient. He states that he no longer has any questions in relation to colonoscopy. No additional questions.

## 2022-10-04 NOTE — Telephone Encounter (Signed)
Inbound call from patient requesting a call to discuss colonoscopy results. Please advise.

## 2022-11-17 ENCOUNTER — Other Ambulatory Visit: Payer: Self-pay | Admitting: Family Medicine

## 2022-11-24 ENCOUNTER — Other Ambulatory Visit: Payer: Self-pay | Admitting: Family Medicine

## 2022-11-26 NOTE — Telephone Encounter (Signed)
Refilled once.  Needs office follow-up.  [name] MD [facility]

## 2022-12-11 ENCOUNTER — Ambulatory Visit: Payer: BC Managed Care – PPO | Admitting: Family Medicine

## 2022-12-11 VITALS — BP 120/80 | HR 70 | Temp 97.7°F | Ht 66.0 in | Wt 149.3 lb

## 2022-12-11 DIAGNOSIS — I10 Essential (primary) hypertension: Secondary | ICD-10-CM | POA: Diagnosis not present

## 2022-12-11 DIAGNOSIS — B351 Tinea unguium: Secondary | ICD-10-CM | POA: Diagnosis not present

## 2022-12-11 DIAGNOSIS — Z79899 Other long term (current) drug therapy: Secondary | ICD-10-CM

## 2022-12-11 DIAGNOSIS — I4891 Unspecified atrial fibrillation: Secondary | ICD-10-CM

## 2022-12-11 MED ORDER — METOPROLOL SUCCINATE ER 25 MG PO TB24: 25.0000 mg | ORAL_TABLET | Freq: Every day | ORAL | 3 refills | Status: DC

## 2022-12-11 MED ORDER — APIXABAN 5 MG PO TABS: 5.0000 mg | ORAL_TABLET | Freq: Two times a day (BID) | ORAL | 3 refills | Status: DC

## 2022-12-11 MED ORDER — OLMESARTAN MEDOXOMIL 20 MG PO TABS: 20.0000 mg | ORAL_TABLET | Freq: Every day | ORAL | 3 refills | Status: AC
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
## 2022-12-12 LAB — CBC WITH DIFFERENTIAL/PLATELET
Basophils Absolute: 0 10*3/uL (ref 0.0–0.1)
Basophils Relative: 0.4 % (ref 0.0–3.0)
Eosinophils Absolute: 0.1 10*3/uL (ref 0.0–0.7)
Eosinophils Relative: 1.4 % (ref 0.0–5.0)
HCT: 49.5 % (ref 39.0–52.0)
Hemoglobin: 17.1 g/dL — ABNORMAL HIGH (ref 13.0–17.0)
Lymphocytes Relative: 26.6 % (ref 12.0–46.0)
Lymphs Abs: 1.6 10*3/uL (ref 0.7–4.0)
MCHC: 34.6 g/dL (ref 30.0–36.0)
MCV: 97.7 fL (ref 78.0–100.0)
Monocytes Absolute: 0.5 10*3/uL (ref 0.1–1.0)
Monocytes Relative: 7.6 % (ref 3.0–12.0)
Neutro Abs: 4 10*3/uL (ref 1.4–7.7)
Neutrophils Relative %: 64 % (ref 43.0–77.0)
Platelets: 205 10*3/uL (ref 150.0–400.0)
RBC: 5.07 Mil/uL (ref 4.22–5.81)
RDW: 13.1 % (ref 11.5–15.5)
WBC: 6.2 10*3/uL (ref 4.0–10.5)

## 2022-12-12 LAB — COMPREHENSIVE METABOLIC PANEL
ALT: 17 U/L (ref 0–53)
AST: 23 U/L (ref 0–37)
Albumin: 4.4 g/dL (ref 3.5–5.2)
Alkaline Phosphatase: 67 U/L (ref 39–117)
BUN: 23 mg/dL (ref 6–23)
CO2: 30 meq/L (ref 19–32)
Calcium: 9.8 mg/dL (ref 8.4–10.5)
Chloride: 104 meq/L (ref 96–112)
Creatinine, Ser: 1.28 mg/dL (ref 0.40–1.50)
GFR: 56.34 mL/min — ABNORMAL LOW (ref >60.00)
Glucose, Bld: 98 mg/dL (ref 70–99)
Potassium: 4.5 meq/L (ref 3.5–5.1)
Sodium: 142 meq/L (ref 135–145)
Total Bilirubin: 0.9 mg/dL (ref 0.2–1.2)
Total Protein: 7 g/dL (ref 6.0–8.3)

## 2022-12-17 MED ORDER — TERBINAFINE HCL 250 MG PO TABS: 250.0000 mg | ORAL_TABLET | Freq: Every day | ORAL | 0 refills | Status: DC

## 2022-12-17 NOTE — Addendum Note (Signed)
Addended by: Christy Sartorius on: 12/17/2022 10:33 AM   Modules accepted: Orders

## 2022-12-17 NOTE — Addendum Note (Signed)
Addended by: Christy Sartorius on: 12/17/2022 09:52 AM   Modules accepted: Orders

## 2023-03-16 ENCOUNTER — Other Ambulatory Visit: Payer: Self-pay | Admitting: Family Medicine

## 2023-03-18 MED ORDER — FLUTICASONE PROPIONATE 50 MCG/ACT NA SUSP: 1.0000 | Freq: Every day | NASAL | 0 refills | Status: DC

## 2023-03-18 NOTE — Telephone Encounter (Signed)
 Patient informed of the message below and is aware the Rx refill was denied.  Patient requested a refill on Flonase and is aware the Rx was sent to [facility].

## 2023-03-25 ENCOUNTER — Other Ambulatory Visit: Payer: Self-pay | Admitting: Family Medicine

## 2023-03-27 MED ORDER — ZOLPIDEM TARTRATE 10 MG PO TABS: 10.0000 mg | ORAL_TABLET | Freq: Every evening | ORAL | 0 refills | Status: DC | PRN

## 2023-05-03 ENCOUNTER — Other Ambulatory Visit: Payer: Self-pay | Admitting: Family Medicine

## 2023-06-14 ENCOUNTER — Other Ambulatory Visit: Payer: Self-pay | Admitting: Family Medicine

## 2023-06-27 ENCOUNTER — Ambulatory Visit (INDEPENDENT_AMBULATORY_CARE_PROVIDER_SITE_OTHER): Admitting: Family Medicine

## 2023-06-27 VITALS — BP 140/84 | HR 79 | Temp 98.4°F | Ht 66.0 in | Wt 148.2 lb

## 2023-06-27 DIAGNOSIS — M545 Low back pain, unspecified: Secondary | ICD-10-CM

## 2023-06-27 DIAGNOSIS — I10 Essential (primary) hypertension: Secondary | ICD-10-CM
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
## 2023-06-28 ENCOUNTER — Ambulatory Visit: Admitting: Family Medicine

## 2023-06-28 ENCOUNTER — Encounter: Payer: Self-pay | Admitting: Family Medicine

## 2023-06-28 VITALS — BP 120/80 | HR 81 | Temp 98.2°F | Wt 148.0 lb

## 2023-06-28 DIAGNOSIS — S39012D Strain of muscle, fascia and tendon of lower back, subsequent encounter: Secondary | ICD-10-CM

## 2023-06-28 DIAGNOSIS — I4891 Unspecified atrial fibrillation: Secondary | ICD-10-CM

## 2023-06-28 DIAGNOSIS — I10 Essential (primary) hypertension: Secondary | ICD-10-CM
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
## 2023-07-17 ENCOUNTER — Ambulatory Visit: Admitting: Family Medicine

## 2023-07-17 ENCOUNTER — Encounter: Payer: Self-pay | Admitting: Family Medicine

## 2023-07-17 VITALS — BP 134/84 | HR 97 | Temp 97.8°F | Wt 150.1 lb

## 2023-07-17 DIAGNOSIS — I4891 Unspecified atrial fibrillation: Secondary | ICD-10-CM

## 2023-07-17 DIAGNOSIS — G4700 Insomnia, unspecified: Secondary | ICD-10-CM

## 2023-07-17 DIAGNOSIS — S39012D Strain of muscle, fascia and tendon of lower back, subsequent encounter: Secondary | ICD-10-CM | POA: Diagnosis not present

## 2023-07-17 DIAGNOSIS — I10 Essential (primary) hypertension: Secondary | ICD-10-CM | POA: Diagnosis not present

## 2023-07-17 MED ORDER — ZOLPIDEM TARTRATE 10 MG PO TABS: 10.0000 mg | ORAL_TABLET | Freq: Every evening | ORAL | 0 refills | Status: DC | PRN
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
## 2023-07-26 ENCOUNTER — Ambulatory Visit: Admitting: Family Medicine

## 2023-08-09 ENCOUNTER — Ambulatory Visit: Admitting: Family Medicine

## 2023-08-16 ENCOUNTER — Encounter: Payer: Self-pay | Admitting: Family Medicine

## 2023-08-16 ENCOUNTER — Ambulatory Visit: Admitting: Family Medicine

## 2023-08-16 VITALS — BP 120/64 | HR 101 | Temp 97.9°F | Wt 150.8 lb

## 2023-08-16 DIAGNOSIS — I4891 Unspecified atrial fibrillation: Secondary | ICD-10-CM

## 2023-08-16 DIAGNOSIS — M545 Low back pain, unspecified: Secondary | ICD-10-CM

## 2023-08-16 DIAGNOSIS — I10 Essential (primary) hypertension: Secondary | ICD-10-CM | POA: Diagnosis not present

## 2023-08-16 NOTE — Patient Instructions (Signed)
 Monitor pulse and be in touch if consistently > 80 at rest.
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
## 2023-11-28 ENCOUNTER — Other Ambulatory Visit: Payer: Self-pay | Admitting: Family Medicine

## 2023-12-25 ENCOUNTER — Other Ambulatory Visit: Payer: Self-pay | Admitting: Family Medicine

## 2024-01-14 ENCOUNTER — Ambulatory Visit: Admitting: Family Medicine

## 2024-01-20 ENCOUNTER — Encounter: Payer: Self-pay | Admitting: Family Medicine

## 2024-01-20 ENCOUNTER — Ambulatory Visit: Admitting: Family Medicine

## 2024-01-20 VITALS — BP 132/80 | HR 84 | Temp 97.6°F | Ht 68.0 in | Wt 149.7 lb

## 2024-01-20 DIAGNOSIS — Z0000 Encounter for general adult medical examination without abnormal findings: Secondary | ICD-10-CM | POA: Diagnosis not present

## 2024-01-20 DIAGNOSIS — H539 Unspecified visual disturbance: Secondary | ICD-10-CM | POA: Diagnosis not present

## 2024-01-20 DIAGNOSIS — I10 Essential (primary) hypertension: Secondary | ICD-10-CM | POA: Diagnosis not present

## 2024-01-20 DIAGNOSIS — E785 Hyperlipidemia, unspecified: Secondary | ICD-10-CM | POA: Diagnosis not present

## 2024-01-20 DIAGNOSIS — R202 Paresthesia of skin: Secondary | ICD-10-CM | POA: Diagnosis not present

## 2024-01-20 LAB — CBC WITH DIFFERENTIAL/PLATELET
Basophils Absolute: 0 K/uL (ref 0.0–0.1)
Basophils Relative: 0.4 % (ref 0.0–3.0)
Eosinophils Absolute: 0.1 K/uL (ref 0.0–0.7)
Eosinophils Relative: 2.3 % (ref 0.0–5.0)
HCT: 51.2 % (ref 39.0–52.0)
Hemoglobin: 17.6 g/dL — ABNORMAL HIGH (ref 13.0–17.0)
Lymphocytes Relative: 29.1 % (ref 12.0–46.0)
Lymphs Abs: 1.7 K/uL (ref 0.7–4.0)
MCHC: 34.3 g/dL (ref 30.0–36.0)
MCV: 95 fl (ref 78.0–100.0)
Monocytes Absolute: 0.5 K/uL (ref 0.1–1.0)
Monocytes Relative: 7.8 % (ref 3.0–12.0)
Neutro Abs: 3.6 K/uL (ref 1.4–7.7)
Neutrophils Relative %: 60.4 % (ref 43.0–77.0)
Platelets: 218 K/uL (ref 150.0–400.0)
RBC: 5.39 Mil/uL (ref 4.22–5.81)
RDW: 13 % (ref 11.5–15.5)
WBC: 5.9 K/uL (ref 4.0–10.5)

## 2024-01-20 LAB — COMPREHENSIVE METABOLIC PANEL WITH GFR
ALT: 48 U/L (ref 3–53)
AST: 49 U/L — ABNORMAL HIGH (ref 5–37)
Albumin: 4.5 g/dL (ref 3.5–5.2)
Alkaline Phosphatase: 61 U/L (ref 39–117)
BUN: 17 mg/dL (ref 6–23)
CO2: 30 meq/L (ref 19–32)
Calcium: 9.8 mg/dL (ref 8.4–10.5)
Chloride: 103 meq/L (ref 96–112)
Creatinine, Ser: 1.09 mg/dL (ref 0.40–1.50)
GFR: 67.79 mL/min
Glucose, Bld: 96 mg/dL (ref 70–99)
Potassium: 4.2 meq/L (ref 3.5–5.1)
Sodium: 141 meq/L (ref 135–145)
Total Bilirubin: 1 mg/dL (ref 0.2–1.2)
Total Protein: 7.6 g/dL (ref 6.0–8.3)

## 2024-01-20 LAB — LIPID PANEL
Cholesterol: 266 mg/dL — ABNORMAL HIGH (ref 28–200)
HDL: 53.8 mg/dL
LDL Cholesterol: 179 mg/dL — ABNORMAL HIGH (ref 10–99)
NonHDL: 212.09
Total CHOL/HDL Ratio: 5
Triglycerides: 166 mg/dL — ABNORMAL HIGH (ref 10.0–149.0)
VLDL: 33.2 mg/dL (ref 0.0–40.0)

## 2024-01-20 LAB — HEMOGLOBIN A1C: Hgb A1c MFr Bld: 5.8 % (ref 4.6–6.5)
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
## 2024-01-20 NOTE — Patient Instructions (Signed)
 I will be setting up MRI brain and carotid doppler of neck to further assess recent events.

## 2024-01-21 ENCOUNTER — Ambulatory Visit: Payer: Self-pay | Admitting: Family Medicine

## 2024-01-21 ENCOUNTER — Telehealth: Payer: Self-pay

## 2024-01-21 DIAGNOSIS — E785 Hyperlipidemia, unspecified: Secondary | ICD-10-CM

## 2024-01-21 NOTE — Telephone Encounter (Signed)
 Copied from CRM [id number]. Topic: Clinical - Lab/Test Results >> Jan 21, 2024  1:14 PM Zy'onna H wrote: Reason for CRM: Patient called in to go over results call that he missed from Good,Mykal - CMA of the PCP.   I called the CAL and Madeline initially stated for me to have NT tell the patient the results, but I insisted the patient would like to speak with a member from his care team instead to further understand his results better.   Madeline advised that she will inform Mykal to perform another outreach to the patient.

## 2024-01-21 NOTE — Telephone Encounter (Signed)
 ATC patient but could not reach at this time please see result note

## 2024-01-22 MED ORDER — ROSUVASTATIN CALCIUM 10 MG PO TABS: 10.0000 mg | ORAL_TABLET | Freq: Every day | ORAL | 2 refills | Status: AC

## 2024-01-22 NOTE — Addendum Note (Signed)
 Addended by: METTA KRISTEN CROME on: 01/22/2024 07:16 AM   Modules accepted: Orders

## 2024-01-29 ENCOUNTER — Other Ambulatory Visit: Payer: Self-pay | Admitting: Family Medicine

## 2024-02-07 ENCOUNTER — Ambulatory Visit
Admission: RE | Admit: 2024-02-07 | Discharge: 2024-02-07 | Disposition: A | Source: Ambulatory Visit | Attending: Family Medicine | Admitting: Family Medicine

## 2024-02-07 DIAGNOSIS — H539 Unspecified visual disturbance: Secondary | ICD-10-CM

## 2024-02-07 DIAGNOSIS — R202 Paresthesia of skin: Secondary | ICD-10-CM

## 2024-02-11 ENCOUNTER — Telehealth: Payer: Self-pay | Admitting: Family Medicine

## 2024-02-11 NOTE — Telephone Encounter (Signed)
 Copied from CRM #[id number]. Topic: General - Other >> Feb 11, 2024  4:00 PM Darshell M wrote: Reason for CRM: Patient returning nurse call regarding MRI. Patient CB# [phone]

## 2024-02-12 NOTE — Telephone Encounter (Signed)
 Please see result note
